# Patient Record
Sex: Female | Born: 1960 | Race: White | Hispanic: No | Marital: Married | Smoking: Never smoker
Health system: Southern US, Community
[De-identification: ages and names within clinical notes are randomized; demographics above are authoritative.]

## PROBLEM LIST (undated history)

## (undated) DIAGNOSIS — T7840XA Allergy, unspecified, initial encounter: Secondary | ICD-10-CM

## (undated) DIAGNOSIS — I773 Arterial fibromuscular dysplasia: Secondary | ICD-10-CM

## (undated) DIAGNOSIS — N3000 Acute cystitis without hematuria: Secondary | ICD-10-CM

## (undated) DIAGNOSIS — F419 Anxiety disorder, unspecified: Secondary | ICD-10-CM

## (undated) HISTORY — DX: Anxiety disorder, unspecified: F41.9

## (undated) HISTORY — DX: Allergy, unspecified, initial encounter: T78.40XA

## (undated) HISTORY — PX: NASAL SEPTUM SURGERY: SHX37

## (undated) HISTORY — DX: Arterial fibromuscular dysplasia: I77.3

## (undated) HISTORY — DX: Acute cystitis without hematuria: N30.00

## (undated) HISTORY — PX: KNEE SURGERY: SHX244

---

## 1999-03-03 ENCOUNTER — Other Ambulatory Visit: Admission: RE | Admit: 1999-03-03 | Discharge: 1999-03-03 | Payer: Self-pay | Admitting: *Deleted

## 1999-09-18 ENCOUNTER — Inpatient Hospital Stay (HOSPITAL_COMMUNITY): Admission: AD | Admit: 1999-09-18 | Discharge: 1999-09-20 | Payer: Self-pay | Admitting: *Deleted

## 2001-11-11 ENCOUNTER — Other Ambulatory Visit: Admission: RE | Admit: 2001-11-11 | Discharge: 2001-11-11 | Payer: Self-pay | Admitting: Obstetrics & Gynecology

## 2002-11-09 ENCOUNTER — Other Ambulatory Visit: Admission: RE | Admit: 2002-11-09 | Discharge: 2002-11-09 | Payer: Self-pay | Admitting: Obstetrics & Gynecology

## 2006-09-25 ENCOUNTER — Emergency Department (HOSPITAL_COMMUNITY): Admission: EM | Admit: 2006-09-25 | Discharge: 2006-09-25 | Payer: Self-pay | Admitting: Emergency Medicine

## 2008-10-25 ENCOUNTER — Emergency Department (HOSPITAL_COMMUNITY): Admission: EM | Admit: 2008-10-25 | Discharge: 2008-10-25 | Payer: Self-pay | Admitting: Emergency Medicine

## 2008-10-30 ENCOUNTER — Emergency Department (HOSPITAL_COMMUNITY): Admission: EM | Admit: 2008-10-30 | Discharge: 2008-10-30 | Payer: Self-pay | Admitting: Emergency Medicine
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
## 2010-12-14 ENCOUNTER — Inpatient Hospital Stay (INDEPENDENT_AMBULATORY_CARE_PROVIDER_SITE_OTHER)
Admission: RE | Admit: 2010-12-14 | Discharge: 2010-12-14 | Disposition: A | Discharge status: Home / Self Care | Payer: BC Managed Care – PPO | Source: Ambulatory Visit | Attending: Family Medicine | Admitting: Family Medicine

## 2010-12-14 DIAGNOSIS — L255 Unspecified contact dermatitis due to plants, except food: Secondary | ICD-10-CM

## 2011-11-30 ENCOUNTER — Ambulatory Visit: Payer: BC Managed Care – PPO | Admitting: Family Medicine

## 2012-02-11 ENCOUNTER — Ambulatory Visit: Payer: BC Managed Care – PPO | Admitting: Family Medicine

## 2013-12-13 ENCOUNTER — Encounter: Payer: Self-pay | Admitting: Podiatrist

## 2013-12-13 ENCOUNTER — Ambulatory Visit (INDEPENDENT_AMBULATORY_CARE_PROVIDER_SITE_OTHER): Payer: 59 | Admitting: Podiatrist

## 2013-12-13 VITALS — BP 144/82 | HR 88 | Resp 16 | Ht 65.0 in | Wt 198.0 lb

## 2013-12-13 DIAGNOSIS — L600 Ingrowing nail: Secondary | ICD-10-CM

## 2013-12-13 NOTE — Progress Notes (Signed)
   Subjective:    Patient ID: Virginia Garner, female    DOB: 07/17/1960, 53 y.o.   MRN: [id number]  HPI Comments: "I have some ingrowns"  Patient c/o tender 1st toe right, lateral border and 2nd toe left, lateral border for few months. The toenails are thick and discolored. The areas are red, swollen. Been using neosporin and trimming them.  Also, the 2nd toenail right foot is thick and discolored.  Toe Pain       Review of Systems  All other systems reviewed and are negative.      Objective:   Physical Exam  Patient is awake, alert, and oriented x 3.  In no acute distress.  Vascular status is intact with palpable pedal pulses at 2/4 DP and PT bilateral and capillary refill time within normal limits. Neurological sensation is also intact bilaterally via Semmes Weinstein monofilament at 5/5 sites. Light touch, vibratory sensation, Achilles tendon reflex is intact. Dermatological exam reveals skin color, turger and texture as normal. No open lesions present.  Musculature intact with dorsiflexion, plantarflexion, inversion, eversion.  Right hallux, 2nd toe bilateral are incurvated and ingrown.  The remainder of the nails 3-5 are normal on both feet.  I previously fixed her left hallux nail and she has had no problems since.  Toenails 1,2 bilateral are also mycotic and thick. There is no sign of infection present in any of the digital nails    Assessment & Plan:  Ingrown toenails right first both borders and 2nd toenails bilateral both borders.  Plan: Treatment options and alternatives discussed.  Discussed a permanent phenol matrixectomy and patient wishes to have this performed on the right first and second toenail as well as the second toenail left foot.  The digits were prepped with alcohol and a 1 to 1 mix of 0.5% marcaine plain and 2% lidocaine plain was administered in a digital block fashion.  The toes were then prepped with betadine solution and exsanguinated.  The offending nail  borders of all nails were then excised and matrix tissue exposed.  Phenol was then applied to the matrix tissue followed by an alcohol wash.  Antibiotic ointment and a dry sterile dressing was applied.  The patient was dispensed instructions for aftercare.

## 2013-12-13 NOTE — Patient Instructions (Signed)
ANTIBACTERIAL SOAP INSTRUCTIONS  THE DAY AFTER PROCEDURE  Please follow the instructions your doctor has marked.   Shower as usual. Before getting out, place a drop of antibacterial liquid soap (Dial) on a wet, clean washcloth.  Gently wipe washcloth over affected area.  Afterward, rinse the area with warm water.  Blot the area dry with a soft cloth and cover with antibiotic ointment (neosporin, polysporin, bacitracin) and band aid or gauze and tape  OR   Place 3-4 drops of antibacterial liquid soap in a quart of warm tap water.  Submerge foot into water for 20 minutes.  If bandage was applied after your procedure, leave on to allow for easy lift off, then remove and continue with soak for the remaining time.  Next, blot area dry with a soft cloth and cover with a bandage.  Apply other medications as directed by your doctor, such as cortisporin otic solution (eardrops) or neosporin antibiotic ointment   Long Term Care Instructions-Post Nail Surgery  You have had your ingrown toenail and root treated with a chemical.  This chemical causes a burn that will drain and ooze like a blister.  This can drain for 6-8 weeks or longer.  It is important to keep this area clean, covered, and follow the soaking instructions dispensed at the time of your surgery.  This area will eventually dry and form a scab.  Once the scab forms you no longer need to soak or apply a dressing.  If at any time you experience an increase in pain, redness, swelling, or drainage, you should contact the office as soon as possible.   Onychomycosis/Fungal Toenails  WHAT IS IT? An infection that lies within the keratin of your nail plate that is caused by a fungus.  WHY ME? Fungal infections affect all ages, sexes, races, and creeds.  There may be many factors that predispose you to a fungal infection such as age, coexisting medical conditions such as diabetes, or an autoimmune disease; stress, medications, fatigue, genetics, etc.   Bottom line: fungus thrives in a warm, moist environment and your shoes offer such a location.  IS IT CONTAGIOUS? Theoretically, yes.  You do not want to share shoes, nail clippers or files with someone who has fungal toenails.  Walking around barefoot in the same room or sleeping in the same bed is unlikely to transfer the organism.  It is important to realize, however, that fungus can spread easily from one nail to the next on the same foot.  HOW DO WE TREAT THIS?  There are several ways to treat this condition.  Treatment may depend on many factors such as age, medications, pregnancy, liver and kidney conditions, etc.  It is best to ask your doctor which options are available to you.  1. No treatment.   Unlike many other medical concerns, you can live with this condition.  However for many people this can be a painful condition and may lead to ingrown toenails or a bacterial infection.  It is recommended that you keep the nails cut short to help reduce the amount of fungal nail. 2. Topical treatment.  These range from herbal remedies to prescription strength nail lacquers.  About 40-50% effective, topicals require twice daily application for approximately 9 to 12 months or until an entirely new nail has grown out.  The most effective topicals are medical grade medications available through physicians offices. 3. Oral antifungal medications.  With an 80-90% cure rate, the most common oral medication requires 3  to 4 months of therapy and stays in your system for a year as the new nail grows out.  Oral antifungal medications do require blood work to make sure it is a safe drug for you.  A liver function panel will be performed prior to starting the medication and after the first month of treatment.  It is important to have the blood work performed to avoid any harmful side effects.  In general, this medication safe but blood work is required. 4. Laser Therapy.  This treatment is performed by applying a  specialized laser to the affected nail plate.  This therapy is noninvasive, fast, and non-painful.  It is not covered by insurance and is therefore, out of pocket.  The results have been very good with a 80-95% cure rate.  The [facility] is the only practice in the area to offer this therapy. 5. Permanent Nail Avulsion.  Removing the entire nail so that a new nail will not grow back.

## 2014-07-12 ENCOUNTER — Encounter (HOSPITAL_COMMUNITY): Payer: Self-pay | Admitting: Emergency Medicine

## 2014-07-12 ENCOUNTER — Emergency Department (HOSPITAL_COMMUNITY)
Admission: EM | Admit: 2014-07-12 | Discharge: 2014-07-12 | Disposition: A | Discharge status: Home / Self Care | Payer: 59 | Source: Home / Self Care | Attending: Family Medicine | Admitting: Family Medicine

## 2014-07-12 ENCOUNTER — Emergency Department (HOSPITAL_COMMUNITY): Payer: 59

## 2014-07-12 DIAGNOSIS — R509 Fever, unspecified: Secondary | ICD-10-CM

## 2014-07-12 LAB — CBC WITH DIFFERENTIAL/PLATELET
BASOS ABS: 0.1 10*3/uL (ref 0.0–0.1)
BASOS PCT: 1 % (ref 0–1)
EOS PCT: 1 % (ref 0–5)
Eosinophils Absolute: 0.1 10*3/uL (ref 0.0–0.7)
HEMATOCRIT: 40.3 % (ref 36.0–46.0)
HEMOGLOBIN: 13.2 g/dL (ref 12.0–15.0)
LYMPHS PCT: 57 % — AB (ref 12–46)
Lymphs Abs: 5.3 10*3/uL — ABNORMAL HIGH (ref 0.7–4.0)
MCH: 28.9 pg (ref 26.0–34.0)
MCHC: 32.8 g/dL (ref 30.0–36.0)
MCV: 88.4 fL (ref 78.0–100.0)
MONO ABS: 0.6 10*3/uL (ref 0.1–1.0)
MONOS PCT: 6 % (ref 3–12)
NEUTROS ABS: 3.2 10*3/uL (ref 1.7–7.7)
Neutrophils Relative %: 35 % — ABNORMAL LOW (ref 43–77)
Platelets: 196 10*3/uL (ref 150–400)
RBC: 4.56 MIL/uL (ref 3.87–5.11)
RDW: 13.9 % (ref 11.5–15.5)
WBC: 9.1 10*3/uL (ref 4.0–10.5)

## 2014-07-12 LAB — POCT URINALYSIS DIP (DEVICE)
Bilirubin Urine: NEGATIVE
GLUCOSE, UA: NEGATIVE mg/dL
HGB URINE DIPSTICK: NEGATIVE
KETONES UR: NEGATIVE mg/dL
LEUKOCYTES UA: NEGATIVE
Nitrite: NEGATIVE
Protein, ur: NEGATIVE mg/dL
SPECIFIC GRAVITY, URINE: 1.01 (ref 1.005–1.030)
UROBILINOGEN UA: 0.2 mg/dL (ref 0.0–1.0)
pH: 7 (ref 5.0–8.0)

## 2014-07-12 LAB — POCT INFECTIOUS MONO SCREEN: Mono Screen: NEGATIVE

## 2014-07-12 LAB — POCT I-STAT, CHEM 8
BUN: 4 mg/dL — ABNORMAL LOW (ref 6–23)
CHLORIDE: 99 mmol/L (ref 96–112)
CREATININE: 0.6 mg/dL (ref 0.50–1.10)
Calcium, Ion: 1.09 mmol/L — ABNORMAL LOW (ref 1.12–1.23)
GLUCOSE: 105 mg/dL — AB (ref 70–99)
HCT: 43 % (ref 36.0–46.0)
Hemoglobin: 14.6 g/dL (ref 12.0–15.0)
Potassium: 4.1 mmol/L (ref 3.5–5.1)
SODIUM: 137 mmol/L (ref 135–145)
TCO2: 26 mmol/L (ref 0–100)

## 2014-07-12 LAB — POCT RAPID STREP A: Streptococcus, Group A Screen (Direct): NEGATIVE

## 2014-07-12 NOTE — ED Provider Notes (Signed)
Virginia Garner is a 54 y.o. female who presents to Urgent Care today for Fever and fatigue present off and on for 2 weeks. Patient has had a few episodes of night sweats as well. Her temperature at home yesterday was 101F. She notes some mild nasal congestion but denies any cough vomiting diarrhea abdominal pain dysuria or vaginal discharge. Her fever is easily controlled with over-the-counter Tylenol or NSAIDs but notes it somewhat persistent now and she is concerned.   History reviewed. No pertinent past medical history. History reviewed. No pertinent past surgical history. History  Substance Use Topics  . Smoking status: Never Smoker   . Smokeless tobacco: Not on file  . Alcohol Use: Yes   ROS as above Medications: No current facility-administered medications for this encounter.   No current outpatient prescriptions on file.   No Known Allergies   Exam:  BP 151/78 mmHg  Pulse 90  Temp(Src) 100.3 F (37.9 C) (Oral)  Resp 18  SpO2 96% Gen: Well NAD nontoxic appearing HEENT: EOMI,  MMM posterior pharynx is relatively normal-appearing no significant erythema or exudate. Normal tympanic membranes bilaterally. Sinuses are nontender. Cervical lymphadenopathy present bilaterally. Lungs: Normal work of breathing. CTABL Heart: RRR no MRG Abd: NABS, Soft. Nondistended, Nontender Exts: Brisk capillary refill, warm and well perfused.   Results for orders placed or performed during the hospital encounter of 07/12/14 (from the past 24 hour(s))  CBC with Differential     Status: Abnormal   Collection Time: 07/12/14 12:16 PM  Result Value Ref Range   WBC 9.1 4.0 - 10.5 K/uL   RBC 4.56 3.87 - 5.11 MIL/uL   Hemoglobin 13.2 12.0 - 15.0 g/dL   HCT 11.940.3 14.736.0 - 82.946.0 %   MCV 88.4 78.0 - 100.0 fL   MCH 28.9 26.0 - 34.0 pg   MCHC 32.8 30.0 - 36.0 g/dL   RDW 56.213.9 13.011.5 - 86.515.5 %   Platelets 196 150 - 400 K/uL   Neutrophils Relative % 35 (L) 43 - 77 %   Neutro Abs 3.2 1.7 - 7.7 K/uL   Lymphocytes Relative 57 (H) 12 - 46 %   Lymphs Abs 5.3 (H) 0.7 - 4.0 K/uL   Monocytes Relative 6 3 - 12 %   Monocytes Absolute 0.6 0.1 - 1.0 K/uL   Eosinophils Relative 1 0 - 5 %   Eosinophils Absolute 0.1 0.0 - 0.7 K/uL   Basophils Relative 1 0 - 1 %   Basophils Absolute 0.1 0.0 - 0.1 K/uL  POCT urinalysis dip (device)     Status: None   Collection Time: 07/12/14 12:24 PM  Result Value Ref Range   Glucose, UA NEGATIVE NEGATIVE mg/dL   Bilirubin Urine NEGATIVE NEGATIVE   Ketones, ur NEGATIVE NEGATIVE mg/dL   Specific Gravity, Urine 1.010 1.005 - 1.030   Hgb urine dipstick NEGATIVE NEGATIVE   pH 7.0 5.0 - 8.0   Protein, ur NEGATIVE NEGATIVE mg/dL   Urobilinogen, UA 0.2 0.0 - 1.0 mg/dL   Nitrite NEGATIVE NEGATIVE   Leukocytes, UA NEGATIVE NEGATIVE  I-STAT, chem 8     Status: Abnormal   Collection Time: 07/12/14 12:28 PM  Result Value Ref Range   Sodium 137 135 - 145 mmol/L   Potassium 4.1 3.5 - 5.1 mmol/L   Chloride 99 96 - 112 mmol/L   BUN 4 (L) 6 - 23 mg/dL   Creatinine, Ser 7.840.60 0.50 - 1.10 mg/dL   Glucose, Bld 696105 (H) 70 - 99 mg/dL   Calcium, Ion  1.09 (L) 1.12 - 1.23 mmol/L   TCO2 26 0 - 100 mmol/L   Hemoglobin 14.6 12.0 - 15.0 g/dL   HCT 25.3 66.4 - 40.3 %  POCT rapid strep A [facility])     Status: None   Collection Time: 07/12/14 12:31 PM  Result Value Ref Range   Streptococcus, Group A Screen (Direct) NEGATIVE NEGATIVE  Infectious mono screen, POC     Status: None   Collection Time: 07/12/14 12:31 PM  Result Value Ref Range   Mono Screen NEGATIVE NEGATIVE   No results found.  Assessment and Plan: 54 y.o. female with fever. Unclear etiology at this time. Labs are not significantly abnormal.  We are unable to perform a chest x-ray today due to but adequate been malfunction. I have ordered an outpatient checks x-rays of the patient will go get tomorrow. Plan to treat with NSAIDs or Tylenol. In 2 weeks if patient is still symptomatic would recommend follow-up  with primary care provider for consideration of referral to infectious disease for workup of fever of unknown origin.  Discussed warning signs or symptoms. Please see discharge instructions. Patient expresses understanding.     Rodolph Bong, MD 07/12/14 [phone]

## 2014-07-12 NOTE — ED Notes (Signed)
Pt states that she has been feeling run down for a few weeks.  She states she has been getting a low grade fever off and on the entire time.  Yesterday the fever was 101.0.  She states she has had some sinus/head congestion and some neck pain at the base of her head.

## 2014-07-12 NOTE — Discharge Instructions (Signed)
Thank you for coming in today. Go to the [facility] ([address]) sometime in the next few days and [facility]. If not improved follow-up with primary care provider and asked for referral to infectious disease Return as needed Continue ibuprofen or Tylenol as needed   Fever, Adult A fever is a higher than normal body temperature. In an adult, an oral temperature around 98.6 F (37 C) is considered normal. A temperature of 100.4 F (38 C) or higher is generally considered a fever. Mild or moderate fevers generally have no long-term effects and often do not require treatment. Extreme fever (greater than or equal to 106 F or 41.1 C) can cause seizures. The sweating that may occur with repeated or prolonged fever may cause dehydration. Elderly people can develop confusion during a fever. A measured temperature can vary with:  Age.  Time of day.  Method of measurement (mouth, underarm, rectal, or ear). The fever is confirmed by taking a temperature with a thermometer. Temperatures can be taken different ways. Some methods are accurate and some are not.  An oral temperature is used most commonly. Electronic thermometers are fast and accurate.  An ear temperature will only be accurate if the thermometer is positioned as recommended by the manufacturer.  A rectal temperature is accurate and done for those adults who have a condition where an oral temperature cannot be taken.  An underarm (axillary) temperature is not accurate and not recommended. Fever is a symptom, not a disease.  CAUSES   Infections commonly cause fever.  Some noninfectious causes for fever include:  Some arthritis conditions.  Some thyroid or adrenal gland conditions.  Some immune system conditions.  Some types of cancer.  A medicine reaction.  High doses of certain street drugs such as methamphetamine.  Dehydration.  Exposure to high outside or room  temperatures.  Occasionally, the source of a fever cannot be determined. This is sometimes called a "fever of unknown origin" (FUO).  Some situations may lead to a temporary rise in body temperature that may go away on its own. Examples are:  Childbirth.  Surgery.  Intense exercise. HOME CARE INSTRUCTIONS   Take appropriate medicines for fever. Follow dosing instructions carefully. If you use acetaminophen to reduce the fever, be careful to avoid taking other medicines that also contain acetaminophen. Do not take aspirin for a fever if you are younger than age 53. There is an association with Reye's syndrome. Reye's syndrome is a rare but potentially deadly disease.  If an infection is present and antibiotics have been prescribed, take them as directed. Finish them even if you start to feel better.  Rest as needed.  Maintain an adequate fluid intake. To prevent dehydration during an illness with prolonged or recurrent fever, you may need to drink extra fluid.Drink enough fluids to keep your urine clear or pale yellow.  Sponging or bathing with room temperature water may help reduce body temperature. Do not use ice water or alcohol sponge baths.  Dress comfortably, but do not over-bundle. SEEK MEDICAL CARE IF:   You are unable to keep fluids down.  You develop vomiting or diarrhea.  You are not feeling at least partly better after 3 days.  You develop new symptoms or problems. SEEK IMMEDIATE MEDICAL CARE IF:   You have shortness of breath or trouble breathing.  You develop excessive weakness.  You are dizzy or you faint.  You are extremely thirsty or you are  making little or no urine.  You develop new pain that was not there before (such as in the head, neck, chest, back, or abdomen).  You have persistent vomiting and diarrhea for more than 1 to 2 days.  You develop a stiff neck or your eyes become sensitive to light.  You develop a skin rash.  You have a fever or  persistent symptoms for more than 2 to 3 days.  You have a fever and your symptoms suddenly get worse. MAKE SURE YOU:   Understand these instructions.  Will watch your condition.  Will get help right away if you are not doing well or get worse. Document Released: 11/04/2000 Document Revised: 09/25/2013 Document Reviewed: 03/12/2011 [facility] Patient Information 2015 [place], [place]. This information is not intended to replace advice given to you by your health care provider. Make sure you discuss any questions you have with your health care provider.

## 2014-07-14 LAB — CULTURE, GROUP A STREP: STREP A CULTURE: NEGATIVE

## 2016-08-05 DIAGNOSIS — R509 Fever, unspecified: Secondary | ICD-10-CM | POA: Diagnosis not present

## 2016-08-05 DIAGNOSIS — R6889 Other general symptoms and signs: Secondary | ICD-10-CM | POA: Diagnosis not present

## 2017-10-10 DIAGNOSIS — N3000 Acute cystitis without hematuria: Secondary | ICD-10-CM | POA: Diagnosis not present

## 2018-05-23 DIAGNOSIS — Z23 Encounter for immunization: Secondary | ICD-10-CM | POA: Diagnosis not present

## 2018-12-13 ENCOUNTER — Encounter: Payer: Self-pay | Admitting: Sports Medicine

## 2018-12-13 ENCOUNTER — Ambulatory Visit: Payer: 59 | Admitting: Sports Medicine

## 2018-12-13 ENCOUNTER — Other Ambulatory Visit: Payer: Self-pay

## 2018-12-13 VITALS — Temp 98.1°F

## 2018-12-13 DIAGNOSIS — L600 Ingrowing nail: Secondary | ICD-10-CM | POA: Diagnosis not present

## 2018-12-13 DIAGNOSIS — M79674 Pain in right toe(s): Secondary | ICD-10-CM | POA: Diagnosis not present

## 2018-12-13 DIAGNOSIS — L608 Other nail disorders: Secondary | ICD-10-CM

## 2018-12-13 MED ORDER — NEOMYCIN-POLYMYXIN-HC 3.5-10000-1 OT SOLN: OTIC | 0 refills | Status: DC

## 2018-12-13 NOTE — Patient Instructions (Signed)

## 2018-12-13 NOTE — Progress Notes (Signed)
Subjective: Virginia Garner is a 58 y.o. female patient presents to office today complaining of a moderately painful incurvated, red, hot, swollen right hallux lateral greater than medial nail margin. This has been present for off and on for several months with the most pain over the last 2 months.  Patient has treated this by self trimming and admits that many years ago had the same nail treated with a procedure in office and it did well initially but over the last few months off and on nail has continued to grow out into the skin and has been very painful at the corners. Patient denies fever/chills/nausea/vomitting/any other related constitutional symptoms at this time.  Review of Systems  All other systems reviewed and are negative.   There are no active problems to display for this patient.   No current outpatient medications on file prior to visit.   No current facility-administered medications on file prior to visit.     No Known Allergies  Objective:  There were no vitals filed for this visit.  General: Well developed, nourished, in no acute distress, alert and oriented x3   Dermatology: Skin is warm, dry and supple bilateral.  Right hallux nail appears to be  severely incurvated with hyperkeratosis formation at the distal aspects of  the medial and lateral nail border. (+) Erythema. (+) Edema. (-) serosanguous  drainage present. The remaining nails appear unremarkable at this time. There are no open sores, lesions or other signs of infection  present.  Vascular: Dorsalis Pedis artery and Posterior Tibial artery pedal pulses are 2/4 bilateral with immedate capillary fill time. Pedal hair growth present. No lower extremity edema.   Neruologic: Grossly intact via light touch bilateral.  Musculoskeletal: Tenderness to palpation of the right hallux bilateral nail folds. Muscular strength within normal limits in all groups bilateral.   Assesement and Plan: Problem List Items  Addressed This Visit    None    Visit Diagnoses    Ingrown toenail    -  Primary   Toe pain, right       Nail deformity          -Discussed treatment alternatives and plan of care; Explained permanent/temporary nail avulsion and post procedure course to patient. Patient elects for PNA right hallux medial and lateral nail borders - After a verbal and written consent, injected 3 ml of a 50:50 mixture of 2% plain  lidocaine and 0.5% plain marcaine in a normal hallux block fashion. Next, a  betadine prep was performed. Anesthesia was tested and found to be appropriate.  The offending right hallux medial and lateral nail borders were then incised from the hyponychium to the epinychium. The offending nail border was removed and cleared from the field. The areas were curretted for any remaining nail or spicules. Phenol application performed and the area was then flushed with alcohol and dressed with antibiotic cream and a dry sterile dressing. -Patient was instructed to leave the dressing intact for today and begin soaking  in a weak solution of betadine or Epsom salt and water tomorrow. Patient was instructed to  soak for 15-20 minutes each day and apply neosporin/corticosporin and a gauze or bandaid dressing each day. -Patient was instructed to monitor the toe for signs of infection and return to office if toe becomes red, hot or swollen. -Advised ice, elevation, and tylenol or motrin if needed for pain.  -Patient is to return in 2 weeks for follow up care/nail check or sooner if problems arise.  Landis Martins, DPM

## 2018-12-27 ENCOUNTER — Encounter: Payer: Self-pay | Admitting: Sports Medicine

## 2018-12-27 ENCOUNTER — Ambulatory Visit: Payer: 59 | Admitting: Sports Medicine

## 2018-12-27 ENCOUNTER — Other Ambulatory Visit: Payer: Self-pay

## 2018-12-27 DIAGNOSIS — Z9889 Other specified postprocedural states: Secondary | ICD-10-CM

## 2018-12-27 DIAGNOSIS — M79674 Pain in right toe(s): Secondary | ICD-10-CM

## 2018-12-27 NOTE — Progress Notes (Signed)
Subjective: Virginia Garner is a 58 y.o. female patient returns to office today for follow up evaluation after having Right Hallux medial/lateral permanent nail avulsion performed on (12-13-18). Patient has been soaking using epsom salt and applying topical antibiotic drops covered with bandaid daily. Patient deniesfever/chills/nausea/vomitting/any other related constitutional symptoms at this time.  There are no active problems to display for this patient.   Current Outpatient Medications on File Prior to Visit  Medication Sig Dispense Refill  . neomycin-polymyxin-hydrocortisone (CORTISPORIN) OTIC solution Apply 1-2 drops to toe after soaking twice a day 10 mL 0   No current facility-administered medications on file prior to visit.     No Known Allergies  Objective:  General: Well developed, nourished, in no acute distress, alert and oriented x3   Dermatology: Skin is warm, dry and supple bilateral. Right hallux medial/lateral nail bed appears to be clean, dry, with mild granular tissue and surrounding eschar/scab. (-) Erythema. (-) Edema. (-) serosanguous drainage present. The remaining nails appear unremarkable at this time. There are no other lesions or other signs of infection present.  Neurovascular status: Intact. No lower extremity swelling; No pain with calf compression bilateral.  Musculoskeletal: Decreased tenderness to palpation of the right hallux nail fold(s). Muscular strength within normal limits bilateral.   Assesement and Plan: Problem List Items Addressed This Visit    None    Visit Diagnoses    S/P nail surgery    -  Primary   Toe pain, right          -Examined patient  -Cleansed right hallux medial and lateral nail folds and gently scrubbed with peroxide and q-tip/curetted away eschar at site and applied antibiotic cream covered with bandaid.  -Discussed plan of care with patient. -Patient to now begin soaking in a weak solution of Epsom salt and warm water.  Patient was instructed to soak for 15-20 minutes each day until the toe appears normal and there is no drainage, redness, tenderness, or swelling at the procedure site, and apply corticosporin and a gauze or bandaid dressing each day as needed for atleast 1 more week. May leave open to air at night. -Educated patient on long term care after nail surgery. -Patient was instructed to monitor the toe for reoccurrence and signs of infection; Patient advised to return to office or go to ER if toe becomes red, hot or swollen. -Patient is to return as needed or sooner if problems arise.  Landis Martins, DPM

## 2021-08-14 ENCOUNTER — Encounter: Payer: Self-pay | Admitting: Family Medicine

## 2021-08-14 ENCOUNTER — Ambulatory Visit (INDEPENDENT_AMBULATORY_CARE_PROVIDER_SITE_OTHER): Payer: BC Managed Care – PPO | Admitting: Family Medicine

## 2021-08-14 VITALS — BP 126/84 | HR 91 | Temp 98.7°F | Ht 65.0 in | Wt 197.4 lb

## 2021-08-14 DIAGNOSIS — R1010 Upper abdominal pain, unspecified: Secondary | ICD-10-CM | POA: Diagnosis not present

## 2021-08-14 LAB — COMPREHENSIVE METABOLIC PANEL
ALT: 19 U/L (ref 0–35)
AST: 16 U/L (ref 0–37)
Albumin: 4.5 g/dL (ref 3.5–5.2)
Alkaline Phosphatase: 84 U/L (ref 39–117)
BUN: 8 mg/dL (ref 6–23)
CO2: 28 mEq/L (ref 19–32)
Calcium: 9.3 mg/dL (ref 8.4–10.5)
Chloride: 105 mEq/L (ref 96–112)
Creatinine, Ser: 0.6 mg/dL (ref 0.40–1.20)
GFR: 97.88 mL/min (ref >60.00)
Glucose, Bld: 94 mg/dL (ref 70–99)
Potassium: 4 mEq/L (ref 3.5–5.1)
Sodium: 141 mEq/L (ref 135–145)
Total Bilirubin: 0.5 mg/dL (ref 0.2–1.2)
Total Protein: 7.4 g/dL (ref 6.0–8.3)

## 2021-08-14 LAB — LIPASE: Lipase: 35 U/L (ref 11.0–59.0)

## 2021-08-14 LAB — CBC WITH DIFFERENTIAL/PLATELET
Basophils Absolute: 0 10*3/uL (ref 0.0–0.1)
Basophils Relative: 0.8 % (ref 0.0–3.0)
Eosinophils Absolute: 0.1 10*3/uL (ref 0.0–0.7)
Eosinophils Relative: 1.9 % (ref 0.0–5.0)
HCT: 38.6 % (ref 36.0–46.0)
Hemoglobin: 13 g/dL (ref 12.0–15.0)
Lymphocytes Relative: 29.7 % (ref 12.0–46.0)
Lymphs Abs: 1.7 10*3/uL (ref 0.7–4.0)
MCHC: 33.6 g/dL (ref 30.0–36.0)
MCV: 89.5 fl (ref 78.0–100.0)
Monocytes Absolute: 0.3 10*3/uL (ref 0.1–1.0)
Monocytes Relative: 5.6 % (ref 3.0–12.0)
Neutro Abs: 3.5 10*3/uL (ref 1.4–7.7)
Neutrophils Relative %: 62 % (ref 43.0–77.0)
Platelets: 295 10*3/uL (ref 150.0–400.0)
RBC: 4.31 Mil/uL (ref 3.87–5.11)
RDW: 13.3 % (ref 11.5–15.5)
WBC: 5.6 10*3/uL (ref 4.0–10.5)

## 2021-08-14 LAB — AMYLASE: Amylase: 33 U/L (ref 27–131)

## 2021-08-14 MED ORDER — OMEPRAZOLE 40 MG PO CPDR: 40.0000 mg | DELAYED_RELEASE_CAPSULE | Freq: Every day | ORAL | 1 refills | Status: DC

## 2021-08-14 NOTE — Progress Notes (Signed)
? ?New Patient Office Visit ? ?Subjective:  ?Patient ID: Virginia Garner, female    DOB: 05-Jun-1960  Age: 61 y.o. MRN: [id number] ? ?CC:  ?Chief Complaint  ?Patient presents with  ? Establish Care  ?  Had coffee and cream, no food ?  ? ? ?HPI ?Virginia Garner presents for new pt ? Since late December-intermitt tightness upper abd and feels like something pushing up on area.  Occ burning upper abd. No wt changes. No pain to palp.  Pretty constant but not worse.  Feels like has to lean back at times. Last pm-jalapenos-more burning.  No n/v/d/c.  Never colonoscopy. No dark stools.  Mgpa-dec 55 "stomach ca".   No dysphagia ?Last pap-Dr. LeVoir-2019 or so. Never mamm.  No spotting.  Menopause about 51.  No pain w/intercourse ?Varicose veins for long time.  More edema L ankle.  ? ?Past Medical History:  ?Diagnosis Date  ? Allergy   ? Anxiety   ? ? ?Past Surgical History:  ?Procedure Laterality Date  ? KNEE SURGERY Right   ? age 19  ? NASAL SEPTUM SURGERY    ? age 13  ? ? ?Family History  ?Problem Relation Age of Onset  ? Hearing loss Mother   ? Depression Mother   ? Arthritis Mother   ? Cancer Father   ? Arthritis Father   ? Prostate cancer Father   ? Cancer Maternal Grandfather   ? Stomach cancer Maternal Grandfather   ? Heart disease Paternal Grandmother   ? Heart disease Paternal Grandfather   ? ? ?Social History  ? ?Socioeconomic History  ? Marital status: Married  ?  Spouse name: Not on file  ? Number of children: 3  ? Years of education: Not on file  ? Highest education level: Not on file  ?Occupational History  ? Not on file  ?Tobacco Use  ? Smoking status: Never  ? Smokeless tobacco: Never  ?Vaping Use  ? Vaping Use: Never used  ?Substance and Sexual Activity  ? Alcohol use: Yes  ?  Comment: sociallly  ? Drug use: Never  ? Sexual activity: Yes  ?Other Topics Concern  ? Not on file  ?Social History Narrative  ? Not on file  ? ?Social Determinants of Health  ? ?Financial Resource Strain: Not on file  ?Food Insecurity: Not  on file  ?Transportation Needs: Not on file  ?Physical Activity: Not on file  ?Stress: Not on file  ?Social Connections: Not on file  ?Intimate Partner Violence: Not on file  ? ? ?ROS  ?ROS: ?Gen: no fever, chills  ?Skin: no rash, itching ?ENT: no ear pain, ear drainage, nasal congestion, rhinorrhea, sinus pressure, sore throat ?Eyes: no blurry vision, double vision ?Resp: no cough, wheeze,SOB ?CV: no CP, palpitations, LE edema,  ?GI: HPI ?GU: no dysuria, urgency, frequency, hematuria ?MSK: occ knee pain L ?Neuro: no dizziness, headache, weakness, vertigo ?Psych: no depression, anxiety, insomnia, SI  ? ?Objective:  ? ?Today's Vitals: BP 126/84   Pulse 91   Temp 98.7 ?F (37.1 ?C) (Temporal)   Ht 5\' 5"  (1.651 m)   Wt 197 lb 6 oz (89.5 kg)   SpO2 97%   BMI 32.84 kg/m?  ? ?Physical Exam  ?Gen: WDWN NAD [facility] ?HEENT: NCAT, conjunctiva not injected, sclera nonicteric ?TM WNL B, OP moist, no exudates  ?NECK:  supple, no thyromegaly, no nodes, no carotid bruits ?CARDIAC: RRR, S1S2+, no murmur. DP 2+B ?LUNGS: CTAB. No wheezes ?ABDOMEN:  BS+, soft, mildly tender  mid epi, No HSM, no masses ?EXT: +edema L>R and significant varicose veins w/old wound L inner lower leg ?MSK: no gross abnormalities.  ?NEURO: A&O x3.  CN II-XII intact.  ?PSYCH: normal mood. Good eye contact  ? ?Assessment & Plan:  ? ?Problem List Items Addressed This Visit   ?None ?Visit Diagnoses   ? ? Pain of upper abdomen    -  Primary  ? ?  ? ? Upper abd pain-?GERD, pancreas, other.  Check amylase,lipase,cbc,cmp.  Start PPI for 12 wks.  If things worse, warning signs, not improving,  consider u/s ?2.  Varicose veins-compression stockings.  Consider vasc ?Outpatient Encounter Medications as of 08/14/2021  ?Medication Sig  ? [DISCONTINUED] neomycin-polymyxin-hydrocortisone (CORTISPORIN) OTIC solution Apply 1-2 drops to toe after soaking twice a day  ? ?No facility-administered encounter medications on file as of 08/14/2021.  ? ? ?Follow-up: No follow-ups on  file.  ? ?Angelena Sole, MD ?

## 2021-08-14 NOTE — Patient Instructions (Signed)
Welcome to [facility]! It was a pleasure meeting you today. ? ?As discussed, Please schedule a 1-2 month follow up visit today. ?If not improving in few weeks, let me know.  Decrease caffeine and spicey foods ? ?PLEASE NOTE: ? ?If you had any LAB tests please let us know if you have not heard back within a few days. You may see your results on MyChart before we have a chance to review them but we will give you a call once they are reviewed by [place]. If we ordered any REFERRALS today, please let us know if you have not heard from their office within the next week.  ?Let us know through MyChart if you are needing REFILLS, or have your pharmacy send [place] the request. You can also use MyChart to communicate with me or any office staff. ? ?Please try these tips to maintain a healthy lifestyle: ? ?Eat most of your calories during the day when you are active. Eliminate processed foods including packaged sweets (pies, cakes, cookies), reduce intake of potatoes, white bread, white pasta, and white rice. Look for whole grain options, oat flour or almond flour. ? ?Each meal should contain half fruits/vegetables, one quarter protein, and one quarter carbs (no bigger than a computer mouse). ? ?Cut down on sweet beverages. This includes juice, soda, and sweet tea. Also watch fruit intake, though this is a healthier sweet option, it still contains natural sugar! Limit to 3 servings daily. ? ?Drink at least 1 glass of water with each meal and aim for at least 8 glasses per day ? ?Exercise at least 150 minutes every week.   ?

## 2021-09-10 ENCOUNTER — Encounter: Payer: Self-pay | Admitting: Family Medicine

## 2021-09-10 ENCOUNTER — Ambulatory Visit (INDEPENDENT_AMBULATORY_CARE_PROVIDER_SITE_OTHER): Payer: BC Managed Care – PPO | Admitting: Family Medicine

## 2021-09-10 ENCOUNTER — Telehealth: Payer: Self-pay | Admitting: Family Medicine

## 2021-09-10 VITALS — BP 140/82 | HR 90 | Temp 98.4°F | Ht 65.0 in | Wt 200.1 lb

## 2021-09-10 DIAGNOSIS — W57XXXA Bitten or stung by nonvenomous insect and other nonvenomous arthropods, initial encounter: Secondary | ICD-10-CM

## 2021-09-10 DIAGNOSIS — S70361A Insect bite (nonvenomous), right thigh, initial encounter: Secondary | ICD-10-CM

## 2021-09-10 MED ORDER — DOXYCYCLINE HYCLATE 100 MG PO TABS: 100.0000 mg | ORAL_TABLET | Freq: Two times a day (BID) | ORAL | 0 refills | Status: DC

## 2021-09-10 NOTE — Telephone Encounter (Signed)
Pt was already seen today by [facility]. ? ?Patient ?Name: ?Virginia MYK ?Garner ?Gender: Female ?DOB: 08-Jul-1960 ?Age: 61 Y 12 D ?Return ?Phone ?Number: ?[phone] ?(Primary), ?[id number] ?(Secondary) ?Address: ?City/ ?State/ ?Zip: ?[place] ? [address] ?[facility] ?Clie ?Health visitor at [facility] ?Contact Type Call ?Who Is Calling Patient / Member / Family / Caregiver ?Call Type Triage / Clinical ?Relationship To Patient Self ?Return Phone Number [phone] (Primary) ?Chief Complaint Tick Bite ?Reason for Call Request to Schedule Office Appointment ?Initial Comment Caller has been bitten by a tick, red circle around ?the bite, already took it out, itchy, needs to ?schedule an appt to be seen today. DR Ruthine Dose. ?Translation No ?No Triage Reason Patient declined ?Nurse Assessment ?Nurse: Suezanne Jacquet, RN, Riley Lam Date/Time (Eastern Time): 09/10/2021 9:20:16 AM ?Confirm and document reason for call. If ?symptomatic, describe symptoms. ?---Caller has been bitten by a tick, red circle around ?the bite, already took it out, itchy, needs to schedule an ?appt to be seen today. DR Ruthine Dose. ?Does the patient have any new or worsening ?symptoms? ---Yes ?Will a triage be completed? ---No ?Select reason for no triage. ---Patient declined ?Please document clinical information provided and ?list any resource used. ?---Caller states she has already secured an early ?morning appointment with her doctor and no longer ?needs our assistance Declines nurse triage at this time. ?Disp. Time (Eastern ?Time) Disposition Final User ?09/10/2021 9:21:23 AM Clinical Call Yes Suezanne Jacquet, RN, Riley Lam ?

## 2021-09-10 NOTE — Patient Instructions (Signed)
It was very nice to see you today! ? ?Cool compresses.   Stay out sun while on doxy ? ? ?PLEASE NOTE: ? ?If you had any lab tests please let us know if you have not heard back within a few days. You may see your results on MyChart before we have a chance to review them but we will give you a call once they are reviewed by [place]. If we ordered any referrals today, please let us know if you have not heard from their office within the next week.  ? ?Please try these tips to maintain a healthy lifestyle: ? ?Eat most of your calories during the day when you are active. Eliminate processed foods including packaged sweets (pies, cakes, cookies), reduce intake of potatoes, white bread, white pasta, and white rice. Look for whole grain options, oat flour or almond flour. ? ?Each meal should contain half fruits/vegetables, one quarter protein, and one quarter carbs (no bigger than a computer mouse). ? ?Cut down on sweet beverages. This includes juice, soda, and sweet tea. Also watch fruit intake, though this is a healthier sweet option, it still contains natural sugar! Limit to 3 servings daily. ? ?Drink at least 1 glass of water with each meal and aim for at least 8 glasses per day ? ?Exercise at least 150 minutes every week.   ?

## 2021-09-10 NOTE — Telephone Encounter (Signed)
Noted  

## 2021-09-10 NOTE — Progress Notes (Signed)
? ?  Subjective:  ? ? ? Patient ID: Virginia Garner, female    DOB: 1960-12-07, 61 y.o.   MRN: [id number] ? ?Chief Complaint  ?Patient presents with  ? Tick Removal  ?  Tick bite on right inner thigh that happened Monday ?Small tick, patient pulled it off, area has a bump with a red circle around it that itches  ? ? ?HPI ?Tick bite R inner thigh for 2 day.  No f/c. No myalgias.  Itchy yesterday.  Today, redness and itchy. ?Had been attached <24 yrs. Tick was not engorged. ? ?Health Maintenance Due  ?Topic Date Due  ? HIV Screening  Never done  ? Hepatitis C Screening  Never done  ? TETANUS/TDAP  Never done  ? PAP SMEAR-Modifier  Never done  ? COLONOSCOPY (Pts 45-88yrs Insurance coverage will need to be confirmed)  Never done  ? MAMMOGRAM  Never done  ? Zoster Vaccines- Shingrix (1 of 2) Never done  ? ? ?Past Medical History:  ?Diagnosis Date  ? Allergy   ? Anxiety   ? ? ?Past Surgical History:  ?Procedure Laterality Date  ? KNEE SURGERY Right   ? age 61-loose body  ? NASAL SEPTUM SURGERY    ? age 77  ? ? ?Outpatient Medications Prior to Visit  ?Medication Sig Dispense Refill  ? omeprazole (PRILOSEC) 40 MG capsule Take 1 capsule (40 mg total) by mouth daily. 90 capsule 1  ? ?No facility-administered medications prior to visit.  ? ? ?No Known Allergies ?ROS neg/noncontributory except as noted HPI/below ?Less GI discomfort.   ? ? ?   ?Objective:  ?  ? ?BP 140/82   Pulse 90   Temp 98.4 ?F (36.9 ?C) (Temporal)   Ht 5\' 5"  (1.651 m)   Wt 200 lb 2 oz (90.8 kg)   SpO2 96%   BMI 33.30 kg/m?  ?Wt Readings from Last 3 Encounters:  ?09/10/21 200 lb 2 oz (90.8 kg)  ?08/14/21 197 lb 6 oz (89.5 kg)  ?12/13/13 198 lb (89.8 kg)  ? ? ?Physical Exam  ? ?Gen: WDWN NAD ?HEENT: NCAT, conjunctiva not injected, sclera nonicteric ?EXT:  no edema ?R upper inner thigh-tick bite-no apparent leftover parts.  Approx 4cm area of erythema. ?MSK: no gross abnormalities.  ?NEURO: A&O x3.  CN II-XII intact.  ?PSYCH: normal mood. Good eye contact ? ?    ?Assessment & Plan:  ? ?Problem List Items Addressed This Visit   ?None ?Visit Diagnoses   ? ? Tick bite of thigh, right, initial encounter    -  Primary  ? ?  ? Tick bite R upper thigh-as >4cm erythema, will tx doxy.  Stay out of sun ? ?No orders of the defined types were placed in this encounter. ? ? ?12/15/13, MD ? ?

## 2021-12-20 ENCOUNTER — Telehealth: Payer: 59 | Admitting: Physician Assistant

## 2021-12-20 DIAGNOSIS — U071 COVID-19: Secondary | ICD-10-CM | POA: Diagnosis not present

## 2021-12-20 MED ORDER — NIRMATRELVIR/RITONAVIR (PAXLOVID)TABLET: 3.0000 | ORAL_TABLET | Freq: Two times a day (BID) | ORAL | 0 refills | Status: AC

## 2021-12-20 NOTE — Patient Instructions (Signed)
For current/suspected COVID symptoms: - Please watch closely for new onset shortness of breath, worsening shortness of breath, dizziness, confusion or any worsening symptoms. If any of these occur, please contact us during business hours, and if after business hours, please seek urgent care or go to the closest emergency room.  -Consider purchasing a pulse oximeter. If your levels are 94% or below persistently, please seek care at the hospital.   -If you test positive for COVID, everyone, regardless of vaccination status, should stay home for 5 days since symptom onset (or if asymptomatic, on day of positive test.) If you have no symptoms or your symptoms are resolving after 5 days, you can leave your house. Continue to wear a mask around others for 5 additional days. If you have a fever, continue to stay home until your fever resolves without use of medication.  -Please inform any contacts of your positive result so they can appropriately quarantine/test.  -Push fluids and try to eat small, frequent meals with protein to maintain your stamina.    

## 2021-12-20 NOTE — Progress Notes (Signed)
Virtual Visit Consent   Virginia Garner, you are scheduled for a virtual visit with a [facility] provider today. Just as with appointments in the office, your consent must be obtained to participate. Your consent will be active for this visit and any virtual visit you may have with one of our providers in the next 365 days. If you have a MyChart account, a copy of this consent can be sent to you electronically.  As this is a virtual visit, video technology does not allow for your provider to perform a traditional examination. This may limit your provider's ability to fully assess your condition. If your provider identifies any concerns that need to be evaluated in person or the need to arrange testing (such as labs, EKG, etc.), we will make arrangements to do so. Although advances in technology are sophisticated, we cannot ensure that it will always work on either your end or our end. If the connection with a video visit is poor, the visit may have to be switched to a telephone visit. With either a video or telephone visit, we are not always able to ensure that we have a secure connection.  By engaging in this virtual visit, you consent to the provision of healthcare and authorize for your insurance to be billed (if applicable) for the services provided during this visit. Depending on your insurance coverage, you may receive a charge related to this service.  I need to obtain your verbal consent now. Are you willing to proceed with your visit today? Virginia Garner has provided verbal consent on 12/20/2021 for a virtual visit (video or telephone). Virginia Garner, [place]  Date: 12/20/2021 10:16 AM  Virtual Visit via Video Note   I, Virginia Garner, connected with  Joyelle Siedlecki  ([id number], [birth year]) on 12/20/21 at 10:15 AM EDT by a video-enabled telemedicine application and verified that I am speaking with the correct person using two identifiers.  Location: Patient: Virtual Visit Location Patient:  Home Provider: Virtual Visit Location Provider: Home Office   I discussed the limitations of evaluation and management by telemedicine and the availability of in person appointments. The patient expressed understanding and agreed to proceed.    History of Present Illness: Virginia Garner is a 61 y.o. who identifies as a female who was assigned female at birth, and is being seen today for COVID.  Husband has COVID, he had sx one day prior to her, he is starting anti-viral. Traveling internationally for the last two weeks -- just got back from [place] last night. Had COVID last December, sx lasted two days and didn't take any rx.  Symptoms now x 3 days -- not getting worse/better. Had subjective fever yesterday. Ear pressure, scratchy throat, dry cough, fatigued. Jet lagged as well. Has not tried anything for symptoms. No hx of asthma, COPD, tobacco use.  Eating and drinking well. Reduced smell.   Has had 3 COVID vaccines.  HPI: HPI  Problems: There are no problems to display for this patient.   Allergies: No Known Allergies Medications:  Current Outpatient Medications:    doxycycline (VIBRA-TABS) 100 MG tablet, Take 1 tablet (100 mg total) by mouth 2 (two) times daily., Disp: 20 tablet, Rfl: 0   omeprazole (PRILOSEC) 40 MG capsule, Take 1 capsule (40 mg total) by mouth daily., Disp: 90 capsule, Rfl: 1  Observations/Objective: Patient is well-developed, well-nourished in no acute distress.  Resting comfortably  at home.  Head is normocephalic, atraumatic.  No labored breathing.  Speech is clear and coherent  with logical content.  Patient is alert and oriented at baseline.   Assessment and Plan: 1. COVID-19 She appears well and without distress Discussed Paxlovid -- she is interested but would also like to wait maybe 24 hours to see if symptoms improve on their own -- she is aware that this needs to be taken within first 5 days of sx onset Rest, fluids, tylenol prn Follow-up with  PCP if any further concerns; urgent care or ER if sudden worsening  Follow Up Instructions: I discussed the assessment and treatment plan with the patient. The patient was provided an opportunity to ask questions and all were answered. The patient agreed with the plan and demonstrated an understanding of the instructions.  A copy of instructions were sent to the patient via MyChart unless otherwise noted below.   The patient was advised to call back or seek an in-person evaluation if the symptoms worsen or if the condition fails to improve as anticipated.  Time:  I spent 15 minutes with the patient via telehealth technology discussing the above problems/concerns.    Virginia Garner, [place]

## 2022-02-16 ENCOUNTER — Encounter: Payer: Self-pay | Admitting: *Deleted

## 2022-05-07 ENCOUNTER — Encounter: Payer: Self-pay | Admitting: *Deleted

## 2022-10-31 ENCOUNTER — Emergency Department (HOSPITAL_COMMUNITY): Payer: 59

## 2022-10-31 ENCOUNTER — Emergency Department (HOSPITAL_COMMUNITY)
Admission: EM | Admit: 2022-10-31 | Discharge: 2022-10-31 | Disposition: A | Discharge status: Home / Self Care | Payer: 59 | Attending: Emergency Medicine | Admitting: Emergency Medicine

## 2022-10-31 ENCOUNTER — Encounter (HOSPITAL_COMMUNITY): Payer: Self-pay | Admitting: Emergency Medicine

## 2022-10-31 ENCOUNTER — Other Ambulatory Visit: Payer: Self-pay

## 2022-10-31 DIAGNOSIS — N3000 Acute cystitis without hematuria: Secondary | ICD-10-CM

## 2022-10-31 DIAGNOSIS — F10120 Alcohol abuse with intoxication, uncomplicated: Secondary | ICD-10-CM | POA: Insufficient documentation

## 2022-10-31 DIAGNOSIS — F10920 Alcohol use, unspecified with intoxication, uncomplicated: Secondary | ICD-10-CM

## 2022-10-31 DIAGNOSIS — W19XXXA Unspecified fall, initial encounter: Secondary | ICD-10-CM

## 2022-10-31 DIAGNOSIS — Y907 Blood alcohol level of 200-239 mg/100 ml: Secondary | ICD-10-CM | POA: Insufficient documentation

## 2022-10-31 DIAGNOSIS — R4781 Slurred speech: Secondary | ICD-10-CM | POA: Diagnosis not present

## 2022-10-31 DIAGNOSIS — I773 Arterial fibromuscular dysplasia: Secondary | ICD-10-CM

## 2022-10-31 DIAGNOSIS — W1839XA Other fall on same level, initial encounter: Secondary | ICD-10-CM | POA: Insufficient documentation

## 2022-10-31 LAB — URINALYSIS, ROUTINE W REFLEX MICROSCOPIC
Bilirubin Urine: NEGATIVE
Glucose, UA: NEGATIVE mg/dL
Hgb urine dipstick: NEGATIVE
Ketones, ur: 5 mg/dL — AB
Nitrite: NEGATIVE
Protein, ur: NEGATIVE mg/dL
Specific Gravity, Urine: 1.01 (ref 1.005–1.030)
pH: 5 (ref 5.0–8.0)

## 2022-10-31 LAB — CBC WITH DIFFERENTIAL/PLATELET
Abs Immature Granulocytes: 0.02 10*3/uL (ref 0.00–0.07)
Basophils Absolute: 0.1 10*3/uL (ref 0.0–0.1)
Basophils Relative: 1 %
Eosinophils Absolute: 0.1 10*3/uL (ref 0.0–0.5)
Eosinophils Relative: 1 %
HCT: 40 % (ref 36.0–46.0)
Hemoglobin: 13.3 g/dL (ref 12.0–15.0)
Immature Granulocytes: 0 %
Lymphocytes Relative: 22 %
Lymphs Abs: 1.7 10*3/uL (ref 0.7–4.0)
MCH: 30.6 pg (ref 26.0–34.0)
MCHC: 33.3 g/dL (ref 30.0–36.0)
MCV: 92 fL (ref 80.0–100.0)
Monocytes Absolute: 0.2 10*3/uL (ref 0.1–1.0)
Monocytes Relative: 3 %
Neutro Abs: 5.5 10*3/uL (ref 1.7–7.7)
Neutrophils Relative %: 73 %
Platelets: 265 10*3/uL (ref 150–400)
RBC: 4.35 MIL/uL (ref 3.87–5.11)
RDW: 13.5 % (ref 11.5–15.5)
WBC: 7.5 10*3/uL (ref 4.0–10.5)
nRBC: 0 % (ref 0.0–0.2)

## 2022-10-31 LAB — I-STAT CHEM 8, ED
BUN: 6 mg/dL — ABNORMAL LOW (ref 8–23)
Calcium, Ion: 0.98 mmol/L — ABNORMAL LOW (ref 1.15–1.40)
Chloride: 105 mmol/L (ref 98–111)
Creatinine, Ser: 0.7 mg/dL (ref 0.44–1.00)
Glucose, Bld: 115 mg/dL — ABNORMAL HIGH (ref 70–99)
HCT: 40 % (ref 36.0–46.0)
Hemoglobin: 13.6 g/dL (ref 12.0–15.0)
Potassium: 3.7 mmol/L (ref 3.5–5.1)
Sodium: 141 mmol/L (ref 135–145)
TCO2: 22 mmol/L (ref 22–32)

## 2022-10-31 LAB — CBG MONITORING, ED: Glucose-Capillary: 123 mg/dL — ABNORMAL HIGH (ref 70–99)

## 2022-10-31 LAB — ETHANOL: Alcohol, Ethyl (B): 231 mg/dL — ABNORMAL HIGH (ref <10)

## 2022-10-31 MED ORDER — CEPHALEXIN 500 MG PO CAPS: 500.0000 mg | ORAL_CAPSULE | Freq: Four times a day (QID) | ORAL | 0 refills | Status: DC

## 2022-10-31 MED ORDER — IOHEXOL 350 MG/ML SOLN
75.0000 mL | Freq: Once | INTRAVENOUS | Status: AC | PRN
  Administered 2022-10-31: 75 mL via INTRAVENOUS

## 2022-10-31 MED ORDER — SODIUM CHLORIDE 0.9 % IV SOLN
1.0000 g | Freq: Once | INTRAVENOUS | Status: AC
  Administered 2022-10-31: 1 g via INTRAVENOUS
  Filled 2022-10-31: qty 10

## 2022-10-31 NOTE — ED Provider Notes (Signed)
[place] EMERGENCY DEPARTMENT AT [facility] Provider Note   CSN: [id number] Arrival date & time: 10/31/22  6606     History  Chief Complaint  Patient presents with   Marletta Lor    Virginia Garner is a 62 y.o. female.  The history is provided by the EMS personnel. The history is limited by the condition of the patient.  Fall This is a new problem. The current episode started less than 1 hour ago. The problem occurs constantly. The problem has been resolved. Pertinent negatives include no chest pain, no abdominal pain, no headaches and no shortness of breath. Nothing aggravates the symptoms. Nothing relieves the symptoms. She has tried nothing for the symptoms. The treatment provided no relief.  Patient who reports she drinks a bottle of wine daily on the weekends presents after drinking wine and then falling.      Past Medical History:  Diagnosis Date   Allergy    Anxiety      Home Medications Prior to Admission medications   Medication Sig Start Date End Date Taking? Authorizing Provider  doxycycline (VIBRA-TABS) 100 MG tablet Take 1 tablet (100 mg total) by mouth 2 (two) times daily. 09/10/21   Jeani Sow, MD  omeprazole (PRILOSEC) 40 MG capsule Take 1 capsule (40 mg total) by mouth daily. 08/14/21   Jeani Sow, MD      Allergies    Patient has no known allergies.    Review of Systems   Review of Systems  Constitutional:  Negative for fever.  HENT:  Negative for facial swelling.   Respiratory:  Negative for shortness of breath.   Cardiovascular:  Negative for chest pain.  Gastrointestinal:  Negative for abdominal pain.  Neurological:  Negative for speech difficulty, weakness, numbness and headaches.  All other systems reviewed and are negative.   Physical Exam Updated Vital Signs BP 107/61   Pulse 90   Temp 98.1 F (36.7 C) (Oral)   Resp 14   Ht 5\' 5"  (1.651 m)   Wt 91 kg   SpO2 97%   BMI 33.38 kg/m  Physical Exam Vitals and nursing note  reviewed.  Constitutional:      General: She is not in acute distress.    Appearance: She is well-developed.  HENT:     Head: Normocephalic and atraumatic.     Nose: Nose normal.     Mouth/Throat:     Mouth: Mucous membranes are moist.     Pharynx: Oropharynx is clear.  Eyes:     Extraocular Movements: Extraocular movements intact.     Pupils: Pupils are equal, round, and reactive to light.  Cardiovascular:     Rate and Rhythm: Normal rate and regular rhythm.     Pulses: Normal pulses.     Heart sounds: Normal heart sounds.  Pulmonary:     Effort: Pulmonary effort is normal. No respiratory distress.     Breath sounds: Normal breath sounds.  Abdominal:     General: Bowel sounds are normal. There is no distension.     Palpations: Abdomen is soft.     Tenderness: There is no abdominal tenderness. There is no guarding or rebound.  Genitourinary:    Vagina: No vaginal discharge.  Musculoskeletal:        General: Normal range of motion.     Cervical back: Normal range of motion and neck supple. No tenderness.  Skin:    General: Skin is warm and dry.     Capillary  Refill: Capillary refill takes less than 2 seconds.     Findings: No erythema or rash.  Neurological:     General: No focal deficit present.     Mental Status: She is oriented to person, place, and time.     Cranial Nerves: No cranial nerve deficit.     Deep Tendon Reflexes: Reflexes normal.  Psychiatric:        Mood and Affect: Mood normal.     ED Results / Procedures / Treatments   Labs (all labs ordered are listed, but only abnormal results are displayed) Results for orders placed or performed during the hospital encounter of 10/31/22  CBC with Differential  Result Value Ref Range   WBC 7.5 4.0 - 10.5 K/uL   RBC 4.35 3.87 - 5.11 MIL/uL   Hemoglobin 13.3 12.0 - 15.0 g/dL   HCT 16.1 09.6 - 04.5 %   MCV 92.0 80.0 - 100.0 fL   MCH 30.6 26.0 - 34.0 pg   MCHC 33.3 30.0 - 36.0 g/dL   RDW 40.9 81.1 - 91.4 %    Platelets 265 150 - 400 K/uL   nRBC 0.0 0.0 - 0.2 %   Neutrophils Relative % 73 %   Neutro Abs 5.5 1.7 - 7.7 K/uL   Lymphocytes Relative 22 %   Lymphs Abs 1.7 0.7 - 4.0 K/uL   Monocytes Relative 3 %   Monocytes Absolute 0.2 0.1 - 1.0 K/uL   Eosinophils Relative 1 %   Eosinophils Absolute 0.1 0.0 - 0.5 K/uL   Basophils Relative 1 %   Basophils Absolute 0.1 0.0 - 0.1 K/uL   Immature Granulocytes 0 %   Abs Immature Granulocytes 0.02 0.00 - 0.07 K/uL  Urinalysis, Routine w reflex microscopic -Urine, Clean Catch  Result Value Ref Range   Color, Urine YELLOW YELLOW   APPearance HAZY (A) CLEAR   Specific Gravity, Urine 1.010 1.005 - 1.030   pH 5.0 5.0 - 8.0   Glucose, UA NEGATIVE NEGATIVE mg/dL   Hgb urine dipstick NEGATIVE NEGATIVE   Bilirubin Urine NEGATIVE NEGATIVE   Ketones, ur 5 (A) NEGATIVE mg/dL   Protein, ur NEGATIVE NEGATIVE mg/dL   Nitrite NEGATIVE NEGATIVE   Leukocytes,Ua MODERATE (A) NEGATIVE   RBC / HPF 0-5 0 - 5 RBC/hpf   WBC, UA 11-20 0 - 5 WBC/hpf   Bacteria, UA RARE (A) NONE SEEN   Squamous Epithelial / HPF 0-5 0 - 5 /HPF   Mucus PRESENT    Hyaline Casts, UA PRESENT   Ethanol  Result Value Ref Range   Alcohol, Ethyl (B) 231 (H) <10 mg/dL  I-stat chem 8, ED (not at [facility], [facility] or [facility])  Result Value Ref Range   Sodium 141 135 - 145 mmol/L   Potassium 3.7 3.5 - 5.1 mmol/L   Chloride 105 98 - 111 mmol/L   BUN 6 (L) 8 - 23 mg/dL   Creatinine, Ser 7.82 0.44 - 1.00 mg/dL   Glucose, Bld 956 (H) 70 - 99 mg/dL   Calcium, Ion 2.13 (L) 1.15 - 1.40 mmol/L   TCO2 22 22 - 32 mmol/L   Hemoglobin 13.6 12.0 - 15.0 g/dL   HCT 08.6 57.8 - 46.9 %  CBG monitoring, ED  Result Value Ref Range   Glucose-Capillary 123 (H) 70 - 99 mg/dL   CT ANGIO HEAD NECK W WO CM  Result Date: 10/31/2022 CLINICAL DATA:  TIA.  Slurred speech in the setting of intoxication. EXAM: CT ANGIOGRAPHY HEAD AND NECK WITH AND  WITHOUT CONTRAST TECHNIQUE: Multidetector CT imaging of the head and neck was  performed using the standard protocol during bolus administration of intravenous contrast. Multiplanar CT image reconstructions and MIPs were obtained to evaluate the vascular anatomy. Carotid stenosis measurements (when applicable) are obtained utilizing NASCET criteria, using the distal internal carotid diameter as the denominator. RADIATION DOSE REDUCTION: This exam was performed according to the departmental dose-optimization program which includes automated exposure control, adjustment of the mA and/or kV according to patient size and/or use of iterative reconstruction technique. CONTRAST:  75mL OMNIPAQUE IOHEXOL 350 MG/ML SOLN COMPARISON:  None Available. FINDINGS: CT HEAD FINDINGS Brain: No evidence of acute infarction, hemorrhage, hydrocephalus, extra-axial collection or mass lesion/mass effect. Vascular: No hyperdense vessel or unexpected calcification. Skull: Normal. Negative for fracture or focal lesion. Sinuses/Orbits: No acute finding. Review of the MIP images confirms the above findings CTA NECK FINDINGS Aortic arch: Mild atheromatous plaque Right carotid system: Minimal plaque at the bifurcation. No stenosis or ulceration. Mild beading to the mid ICA. Left carotid system: Mild plaque at the bifurcation. No stenosis or ulceration Vertebral arteries: Subclavian and vertebral arteries are smoothly contoured and widely patent. Mild calcified plaque at the left vertebral origin. Skeleton: No acute or aggressive finding Other neck: No acute finding Upper chest: Clear apical lungs Review of the MIP images confirms the above findings CTA HEAD FINDINGS Anterior circulation: No significant stenosis, proximal occlusion, aneurysm, or vascular malformation. Posterior circulation: No significant stenosis, proximal occlusion, aneurysm, or vascular malformation. Venous sinuses: Diffusely patent Anatomic variants: None significant Review of the MIP images confirms the above findings IMPRESSION: 1. No emergent vascular  finding. 2. Very mild atheromatous plaque. 3. 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 Electronically Signed   By: Tiburcio Pea M.D.   On: 10/31/2022 04:22     Radiology CT ANGIO HEAD NECK W WO CM  Result Date: 10/31/2022 CLINICAL DATA:  TIA.  Slurred speech in the setting of intoxication. EXAM: CT ANGIOGRAPHY HEAD AND NECK WITH AND WITHOUT CONTRAST TECHNIQUE: Multidetector CT imaging of the head and neck was performed using the standard protocol during bolus administration of intravenous contrast. Multiplanar CT image reconstructions and MIPs were obtained to evaluate the vascular anatomy. Carotid stenosis measurements (when applicable) are obtained utilizing NASCET criteria, using the distal internal carotid diameter as the denominator. RADIATION DOSE REDUCTION: This exam was performed according to the departmental dose-optimization program which includes automated exposure control, adjustment of the mA and/or kV according to patient size and/or use of iterative reconstruction technique. CONTRAST:  75mL OMNIPAQUE IOHEXOL 350 MG/ML SOLN COMPARISON:  None Available. FINDINGS: CT HEAD FINDINGS Brain: No evidence of acute infarction, hemorrhage, hydrocephalus, extra-axial collection or mass lesion/mass effect. Vascular: No hyperdense vessel or unexpected calcification. Skull: Normal. Negative for fracture or focal lesion. Sinuses/Orbits: No acute finding. Review of the MIP images confirms the above findings CTA NECK FINDINGS Aortic arch: Mild atheromatous plaque Right carotid system: Minimal plaque at the bifurcation. No stenosis or ulceration. Mild beading to the mid ICA. Left carotid system: Mild plaque at the bifurcation. No stenosis or ulceration Vertebral arteries: Subclavian and vertebral arteries are smoothly contoured and widely patent. Mild calcified plaque at the left vertebral origin. Skeleton: No acute or aggressive finding Other neck: No acute finding Upper chest: Clear  apical lungs Review of the MIP images confirms the above findings CTA HEAD FINDINGS Anterior circulation: No significant stenosis, proximal occlusion, aneurysm, or vascular malformation. Posterior circulation: No significant stenosis, proximal occlusion, aneurysm, or vascular  malformation. Venous sinuses: Diffusely patent Anatomic variants: None significant Review of the MIP images confirms the above findings IMPRESSION: 1. No emergent vascular finding. 2. Very mild atheromatous plaque. 3. 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 Electronically Signed   By: Tiburcio Pea M.D.   On: 10/31/2022 04:22    Procedures Procedures    Medications Ordered in ED Medications  cefTRIAXone (ROCEPHIN) 1 g in sodium chloride 0.9 % 100 mL IVPB (1 g Intravenous New Bag/Given 10/31/22 0505)  iohexol (OMNIPAQUE) 350 MG/ML injection 75 mL (75 mLs Intravenous Contrast Given 10/31/22 0406)    EKG Interpretation  Date/Time:  Saturday October 31 2022 03:24:01 EDT Ventricular Rate:  83 PR Interval:  242 QRS Duration: 100 QT Interval:  385 QTC Calculation: 453 R Axis:   -13 Text Interpretation: Sinus rhythm Prolonged PR interval Confirmed by Petrea Fredenburg ([id number]) on 10/31/2022 5:25:41 AM        ED Course/ Medical Decision Making/ A&P                             Medical Decision Making Patient fell following drinking a bottle of wine   Problems Addressed: Acute cystitis without hematuria: acute illness or injury    Details: Rocephin in the ED and keflex x 7 days for home  Alcoholic intoxication without complication [facility]): acute illness or injury Fibromuscular dysplasia of carotid artery [facility]):    Details: Follow up with PMD and neurology as an outpatient   Amount and/or Complexity of Data Reviewed Independent Historian: EMS    Details: See above  External Data Reviewed: notes.    Details: Previous outside notes reviewed.   Labs: ordered.    Details: Normal white count 7.5, normal  hemoglobin 13.3, normal platelet count,. URine is positive for UTI. Alcohol elevated 231, normal sodium 141, normal potassium 3.7, normal creatinine.7  Radiology: ordered and independent interpretation performed.    Details: No bleed or acute finding on CT head by me  ECG/medicine tests: ordered and independent interpretation performed. Decision-making details documented in ED Course. Discussion of management or test interpretation with external provider(s): Case d/w Dr. Amada Jupiter of neurology, nothing to do acutely for fibromuscular dyplasia.  Follow up as an outpatient.    Risk Prescription drug management.    Final Clinical Impression(s) / ED Diagnoses Final diagnoses:  Fall, initial encounter  Acute cystitis without hematuria  Alcoholic intoxication without complication (HCC)   Return for intractable cough, coughing up blood, fevers > 100.4 unrelieved by medication, shortness of breath, intractable vomiting, chest pain, shortness of breath, weakness, numbness, changes in speech, facial asymmetry, abdominal pain, passing out, Inability to tolerate liquids or food, cough, altered mental status or any concerns. No signs of systemic illness or infection. The patient is nontoxic-appearing on exam and vital signs are within normal limits.  I have reviewed the triage vital signs and the nursing notes. Pertinent labs & imaging results that were available during my care of the patient were reviewed by me and considered in my medical decision making (see chart for details). After history, exam, and medical workup I feel the patient has been appropriately medically screened and is safe for discharge home. Pertinent diagnoses were discussed with the patient. Patient was given return precautions Rx / DC Orders ED Discharge Orders     None         Mileigh Tilley, MD 10/31/22 6045

## 2022-10-31 NOTE — ED Triage Notes (Signed)
Pt arrive by EMS from home after falling from sitting position, ETOH on board per EMS pt had 6 glasses of wine on a 6 hours period. Per family pt was very confuse with slurred speech after falling. Pt is AO x 4 on arrival to ED with no slurred speech.

## 2022-12-04 ENCOUNTER — Ambulatory Visit (INDEPENDENT_AMBULATORY_CARE_PROVIDER_SITE_OTHER): Payer: 59 | Admitting: Family Medicine

## 2022-12-04 ENCOUNTER — Encounter: Payer: Self-pay | Admitting: Family Medicine

## 2022-12-04 VITALS — BP 130/84 | HR 81 | Temp 98.5°F | Resp 18 | Ht 65.0 in | Wt 202.4 lb

## 2022-12-04 DIAGNOSIS — I773 Arterial fibromuscular dysplasia: Secondary | ICD-10-CM

## 2022-12-04 DIAGNOSIS — N3000 Acute cystitis without hematuria: Secondary | ICD-10-CM

## 2022-12-04 NOTE — Progress Notes (Signed)
Subjective:     Patient ID: Virginia Garner, female    DOB: [birth year], 62 y.o.   MRN: [id number]  Chief Complaint  Patient presents with   Follow-up    ED follow-up on 10/31/22, had a fall  Follow-up on bladder infection Discuss CT results from the hospital     HPI Fall - Presented to the ED on 6/8 after a fall. She reports she was hosting family and friends in her backyard. Tripped and fell backwards after her flip flops got stuck on the lift near her deck. Endorses a hematoma on her arm and slight back pain. Was not planning to go to ED but went at the concern of her son and his friend. She did not have dinner that night and was drinking wine. Does not attribute her fall to her drinking. She notes she occasionally has a bottle of wine on weekends, but not often. Only has had 1 beer in the last month. Denies any LOC.   Fibromuscular dysplasia - Head CT on 6/8 revealed fibromuscular dysplasia at the right cervical ICA. ED advised referral to neurology. Neurology stated this concern is not addressed by their department. She is planning to schedule a referral with cardiology.   UTI - She reports concerns about positive UTI during ED visit. ED advised her to follow up with urology.   Health Maintenance Due  Topic Date Due   HIV Screening  Never done   Hepatitis C Screening  Never done   DTaP/Tdap/Td (1 - Tdap) Never done   PAP SMEAR-Modifier  Never done   Colonoscopy  Never done   MAMMOGRAM  Never done    Past Medical History:  Diagnosis Date   Allergy    Anxiety     Past Surgical History:  Procedure Laterality Date   KNEE SURGERY Right    age 23-loose body   NASAL SEPTUM SURGERY     age 69    No current outpatient medications on file.  No Known Allergies ROS neg/noncontributory except as noted HPI/below      Objective:     BP 130/84   Pulse 81   Temp 98.5 F (36.9 C) (Temporal)   Resp 18   Ht 5\' 5"  (1.651 m)   Wt 202 lb 6 oz (91.8 kg)   SpO2 98%   BMI 33.68  kg/m  Wt Readings from Last 3 Encounters:  12/04/22 202 lb 6 oz (91.8 kg)  10/31/22 200 lb 9.9 oz (91 kg)  09/10/21 200 lb 2 oz (90.8 kg)    Physical Exam   Gen: WDWN NAD HEENT: NCAT, conjunctiva not injected, sclera nonicteric NECK:  supple, no thyromegaly, no nodes, no carotid bruits CARDIAC: RRR, S1S2+, no murmur. DP 2+B. +Tiny bruit on right carotid LUNGS: CTAB. No wheezes ABDOMEN:  BS+, soft, NTND, No HSM, no masses EXT:  no edema MSK: no gross abnormalities.  NEURO: A&O x3.  CN II-XII intact.  PSYCH: normal mood. Good eye contact  Reviewed ER records. Reviewed and updated meds.     Assessment & Plan:  Acute cystitis without hematuria -     POCT Urinalysis Dipstick (Automated)  Fibromuscular dysplasia of carotid artery [facility]) -     Ambulatory referral to Cardiology    Return for soon annual.   I,Rachel Rivera,acting as a scribe for Angelena Sole, MD.,have documented all relevant documentation on the behalf of Angelena Sole, MD,as directed by  Angelena Sole, MD while in the presence of Ruffin Frederick  Ruthine Dose, MD.  I, Isabelle Course, have reviewed all documentation for this visit. The documentation on 12/04/22 for the exam, diagnosis, procedures, and orders are all accurate and complete.  *** Isabelle Course

## 2022-12-04 NOTE — Patient Instructions (Signed)

## 2022-12-07 ENCOUNTER — Other Ambulatory Visit (INDEPENDENT_AMBULATORY_CARE_PROVIDER_SITE_OTHER): Payer: 59

## 2022-12-07 DIAGNOSIS — N3000 Acute cystitis without hematuria: Secondary | ICD-10-CM | POA: Diagnosis not present

## 2022-12-07 LAB — POC URINALSYSI DIPSTICK (AUTOMATED)
Bilirubin, UA: NEGATIVE
Blood, UA: NEGATIVE
Glucose, UA: NEGATIVE
Ketones, UA: NEGATIVE
Nitrite, UA: NEGATIVE
Protein, UA: NEGATIVE
Spec Grav, UA: 1.01 (ref 1.010–1.025)
Urobilinogen, UA: 0.2 E.U./dL
pH, UA: 6.5 (ref 5.0–8.0)

## 2022-12-07 NOTE — Addendum Note (Signed)
Addended by: Angelena Sole on: 12/07/2022 12:24 PM   Modules accepted: Orders

## 2022-12-07 NOTE — Addendum Note (Signed)
Addended by: Lorn Junes on: 12/07/2022 12:50 PM   Modules accepted: Orders

## 2022-12-08 LAB — URINE CULTURE
MICRO NUMBER:: 15200417
SPECIMEN QUALITY:: ADEQUATE

## 2022-12-09 ENCOUNTER — Other Ambulatory Visit: Payer: Self-pay | Admitting: *Deleted

## 2022-12-09 DIAGNOSIS — R82998 Other abnormal findings in urine: Secondary | ICD-10-CM

## 2022-12-22 ENCOUNTER — Telehealth: Payer: Self-pay | Admitting: Family Medicine

## 2022-12-22 NOTE — Telephone Encounter (Signed)
I have spoken with patient in regard. 

## 2022-12-22 NOTE — Telephone Encounter (Signed)
Patient requests to be called. States insurance only allows her to have 1 Physical per year. Patient is concerned that since she has a Physical with her Gynecologist she would not be covered by insurance to have CPE with Dr. Ruthine Dose.

## 2023-01-14 ENCOUNTER — Encounter: Payer: 59 | Admitting: Family Medicine

## 2023-03-03 ENCOUNTER — Other Ambulatory Visit: Payer: Self-pay

## 2023-03-03 ENCOUNTER — Ambulatory Visit (INDEPENDENT_AMBULATORY_CARE_PROVIDER_SITE_OTHER): Payer: 59 | Admitting: Family Medicine

## 2023-03-03 ENCOUNTER — Encounter: Payer: Self-pay | Admitting: Family Medicine

## 2023-03-03 VITALS — BP 136/84 | HR 90 | Temp 98.3°F | Resp 18 | Ht 65.0 in | Wt 205.5 lb

## 2023-03-03 DIAGNOSIS — Z1159 Encounter for screening for other viral diseases: Secondary | ICD-10-CM | POA: Diagnosis not present

## 2023-03-03 DIAGNOSIS — Z0000 Encounter for general adult medical examination without abnormal findings: Secondary | ICD-10-CM | POA: Diagnosis not present

## 2023-03-03 DIAGNOSIS — R82998 Other abnormal findings in urine: Secondary | ICD-10-CM | POA: Diagnosis not present

## 2023-03-03 LAB — COMPREHENSIVE METABOLIC PANEL
ALT: 16 U/L (ref 0–35)
AST: 19 U/L (ref 0–37)
Albumin: 4.2 g/dL (ref 3.5–5.2)
Alkaline Phosphatase: 99 U/L (ref 39–117)
BUN: 10 mg/dL (ref 6–23)
CO2: 27 meq/L (ref 19–32)
Calcium: 9.1 mg/dL (ref 8.4–10.5)
Chloride: 104 meq/L (ref 96–112)
Creatinine, Ser: 0.53 mg/dL (ref 0.40–1.20)
GFR: 99.76 mL/min (ref >60.00)
Glucose, Bld: 98 mg/dL (ref 70–99)
Potassium: 3.7 meq/L (ref 3.5–5.1)
Sodium: 139 meq/L (ref 135–145)
Total Bilirubin: 0.5 mg/dL (ref 0.2–1.2)
Total Protein: 7 g/dL (ref 6.0–8.3)

## 2023-03-03 LAB — LIPID PANEL
Cholesterol: 184 mg/dL (ref 0–200)
HDL: 56.9 mg/dL (ref >39.00)
LDL Cholesterol: 106 mg/dL — ABNORMAL HIGH (ref 0–99)
NonHDL: 127.35
Total CHOL/HDL Ratio: 3
Triglycerides: 106 mg/dL (ref 0.0–149.0)
VLDL: 21.2 mg/dL (ref 0.0–40.0)

## 2023-03-03 LAB — HEMOGLOBIN A1C: Hgb A1c MFr Bld: 5.5 % (ref 4.6–6.5)

## 2023-03-03 LAB — CBC WITH DIFFERENTIAL/PLATELET
Basophils Absolute: 0 10*3/uL (ref 0.0–0.1)
Basophils Relative: 0.9 % (ref 0.0–3.0)
Eosinophils Absolute: 0.1 10*3/uL (ref 0.0–0.7)
Eosinophils Relative: 2.3 % (ref 0.0–5.0)
HCT: 40.5 % (ref 36.0–46.0)
Hemoglobin: 13.3 g/dL (ref 12.0–15.0)
Lymphocytes Relative: 30.8 % (ref 12.0–46.0)
Lymphs Abs: 1.7 10*3/uL (ref 0.7–4.0)
MCHC: 32.8 g/dL (ref 30.0–36.0)
MCV: 90.3 fL (ref 78.0–100.0)
Monocytes Absolute: 0.4 10*3/uL (ref 0.1–1.0)
Monocytes Relative: 6.4 % (ref 3.0–12.0)
Neutro Abs: 3.3 10*3/uL (ref 1.4–7.7)
Neutrophils Relative %: 59.6 % (ref 43.0–77.0)
Platelets: 307 10*3/uL (ref 150.0–400.0)
RBC: 4.49 Mil/uL (ref 3.87–5.11)
RDW: 14.5 % (ref 11.5–15.5)
WBC: 5.6 10*3/uL (ref 4.0–10.5)

## 2023-03-03 LAB — URINALYSIS, ROUTINE W REFLEX MICROSCOPIC
Bilirubin Urine: NEGATIVE
Hgb urine dipstick: NEGATIVE
Ketones, ur: NEGATIVE
Leukocytes,Ua: NEGATIVE
Nitrite: NEGATIVE
Specific Gravity, Urine: <=1.005 — AB (ref 1.000–1.030)
Total Protein, Urine: NEGATIVE
Urine Glucose: NEGATIVE
Urobilinogen, UA: 0.2 (ref 0.0–1.0)
pH: 6.5 (ref 5.0–8.0)

## 2023-03-03 LAB — TSH: TSH: 1.61 u[IU]/mL (ref 0.35–5.50)

## 2023-03-03 NOTE — Addendum Note (Signed)
Addended by: Lorn Junes on: 03/03/2023 10:43 AM   Modules accepted: Orders

## 2023-03-03 NOTE — Patient Instructions (Addendum)
It was very nice to see you today! Look for Tdap records.     PLEASE NOTE:  If you had any lab tests please let us know if you have not heard back within a few days. You may see your results on MyChart before we have a chance to review them but we will give you a call once they are reviewed by [place]. If we ordered any referrals today, please let us know if you have not heard from their office within the next week.   Please try these tips to maintain a healthy lifestyle:  Eat most of your calories during the day when you are active. Eliminate processed foods including packaged sweets (pies, cakes, cookies), reduce intake of potatoes, white bread, white pasta, and white rice. Look for whole grain options, oat flour or almond flour.  Each meal should contain half fruits/vegetables, one quarter protein, and one quarter carbs (no bigger than a computer mouse).  Cut down on sweet beverages. This includes juice, soda, and sweet tea. Also watch fruit intake, though this is a healthier sweet option, it still contains natural sugar! Limit to 3 servings daily.  Drink at least 1 glass of water with each meal and aim for at least 8 glasses per day  Exercise at least 150 minutes every week.

## 2023-03-03 NOTE — Progress Notes (Signed)
Phone [phone]   Subjective:   Patient is a 62 y.o. female presenting for annual physical.    Chief Complaint  Patient presents with   Annual Exam    CPE Fasting, black coffee and water Pap scheduled for 03/17/23   Annual - Pt states she has not been exercising due to increased work load.  Thinks Tdap 2019-2020.  Pap and mamm at gyn later this month.    Irritable Bowels- Pt states she has struggled with intermittent  irritable bowels since her early 20's due to anxiety. She reports that her anxiety has increased over the past 2 weeks due to the [place] in [place] and [place] affecting her friends and family.   See problem oriented charting- ROS- ROS: Gen: no fever, chills  Skin: no rash, itching ENT: no ear pain, ear drainage, nasal congestion, rhinorrhea, sinus pressure, sore throat Eyes: no blurry vision, double vision Resp: no cough, wheeze,SOB CV: no CP, palpitations, LE edema,  GI: no heartburn, n/v//c, abd pain GU: no dysuria, urgency, frequency, hematuria MSK: no, myalgias, back pain, +joint pain-knees-chronic Neuro: no dizziness, headache, weakness, vertigo Psych: no depression, anxiety, insomnia, SI   The following were reviewed and entered/updated in epic: Past Medical History:  Diagnosis Date   Acute cystitis without hematuria    Allergy    Anxiety    Fibromuscular dysplasia of carotid artery (HCC)    There are no problems to display for this patient.  Past Surgical History:  Procedure Laterality Date   KNEE SURGERY Right    age 52-loose body   NASAL SEPTUM SURGERY     age 83    Family History  Problem Relation Age of Onset   Hearing loss Mother    Depression Mother    Arthritis Mother    Cancer Father    Arthritis Father    Prostate cancer Father    Cancer Maternal Grandfather    Stomach cancer Maternal Grandfather    Heart disease Paternal Grandmother    Heart disease Paternal Grandfather     Medications- reviewed and updated No  current outpatient medications on file.   No current facility-administered medications for this visit.    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 DP 2+B LUNGS: CTAB. No wheezes ABDOMEN:  BS+, soft, NTND, No HSM, no masses EXT:  no edema MSK: no gross abnormalities. MS 5/5 all 4 NEURO: A&O x3.  CN II-XII intact.  PSYCH: normal mood. Good eye contact    Assessment and Plan   Health Maintenance counseling: 1. Anticipatory guidance: Patient counseled regarding regular dental exams q6 months, eye exams,  avoiding smoking and second hand smoke, limiting alcohol to 1 beverage per day, no illicit drugs.   2. Risk factor reduction:  Advised patient of need for regular exercise and diet rich and fruits and vegetables to reduce risk of heart attack and stroke.  Exercise- Not regularly exercising .  Wt Readings from Last 3 Encounters:  03/03/23 205 lb 8 oz (93.2 kg)  12/04/22 202 lb 6 oz (91.8 kg)  10/31/22 200 lb 9.9 oz (91 kg)   3. Immunizations/screenings/ancillary studies Immunization History  Administered Date(s) Administered   Influenza Inj Mdck Quad With Preservative 05/23/2018   Influenza-Unspecified 03/08/2021   PFIZER(Purple Top)SARS-COV-2 Vaccination 07/21/2019, 08/11/2019, 01/20/2020   Health Maintenance Due  Topic Date Due   HIV Screening  Never done   Hepatitis C Screening  Never done   DTaP/Tdap/Td (1 - Tdap) Never done   Cervical Cancer Screening (HPV/Pap Cotest)  Never done   MAMMOGRAM   Never done    4. Cervical cancer screening- scheduled for 03/17/23  5. Breast cancer screening-  mammogram never done 6. Colon cancer screening - declined, states she will consider.  Declined cologard as considering cscope 7. Skin cancer screening- advised regular sunscreen use. Denies worrisome, changing, or new skin lesions.  8. Birth control/STD check- n/a 9. Osteoporosis screening- never done 10. Smoking associated screening - non smoker  Wellness examination -     CBC with Differential/Platelet -     Comprehensive metabolic panel -     Lipid panel -     TSH -     Hemoglobin A1c -     Hepatitis C antibody -     HIV Antibody (routine testing w rflx)  Screening for viral disease -     Hepatitis C antibody -     HIV Antibody (routine testing w rflx)   Wellness-anticipatory guidance.  Work on Diet/Exercise  Check CBC,CMP,lipids,TSH, A1C.  F/u 1 yr  Recommended follow up: Return in about 1 year (around 03/02/2024) for annual physical.  Lab/Order associations: fasting - black coffee  I,Anaiya N Rice,acting as a scribe for Angelena Sole, MD.,have documented all relevant documentation on the behalf of Angelena Sole, MD,as directed by  Angelena Sole, MD while in the presence of Angelena Sole, MD.  I, Angelena Sole, MD, have reviewed all documentation for this visit. The documentation on 03/03/23 for the exam, diagnosis, procedures, and orders are all accurate and complete.   Angelena Sole, MD

## 2023-03-04 LAB — HEPATITIS C ANTIBODY: Hepatitis C Ab: NONREACTIVE

## 2023-03-04 LAB — HIV ANTIBODY (ROUTINE TESTING W REFLEX): HIV 1&2 Ab, 4th Generation: NONREACTIVE

## 2023-03-04 NOTE — Progress Notes (Signed)
Labs great

## 2023-03-05 ENCOUNTER — Encounter (HOSPITAL_BASED_OUTPATIENT_CLINIC_OR_DEPARTMENT_OTHER): Payer: Self-pay | Admitting: Cardiology

## 2023-03-05 ENCOUNTER — Ambulatory Visit (INDEPENDENT_AMBULATORY_CARE_PROVIDER_SITE_OTHER): Payer: 59 | Admitting: Cardiology

## 2023-03-05 VITALS — BP 130/84 | HR 97 | Ht 64.5 in | Wt 207.5 lb

## 2023-03-05 DIAGNOSIS — E66812 Obesity, class 2: Secondary | ICD-10-CM | POA: Diagnosis not present

## 2023-03-05 DIAGNOSIS — Z6835 Body mass index (BMI) 35.0-35.9, adult: Secondary | ICD-10-CM

## 2023-03-05 DIAGNOSIS — Z713 Dietary counseling and surveillance: Secondary | ICD-10-CM | POA: Diagnosis not present

## 2023-03-05 DIAGNOSIS — Z7189 Other specified counseling: Secondary | ICD-10-CM | POA: Diagnosis not present

## 2023-03-05 DIAGNOSIS — I773 Arterial fibromuscular dysplasia: Secondary | ICD-10-CM | POA: Diagnosis not present

## 2023-03-05 DIAGNOSIS — E6609 Other obesity due to excess calories: Secondary | ICD-10-CM

## 2023-03-05 DIAGNOSIS — I7090 Unspecified atherosclerosis: Secondary | ICD-10-CM

## 2023-03-05 NOTE — Patient Instructions (Addendum)
Medication Instructions:  Your physician recommends that you continue on your current medications as directed. Please refer to the Current Medication list given to you today.  *If you need a refill on your cardiac medications before your next appointment, please call your pharmacy*  Lab Work: NONE  Testing/Procedures: Your physician has requested that you have a renal artery duplex. During this test, an ultrasound is used to evaluate blood flow to the kidneys. Allow one hour for this exam. Do not eat after midnight the day before and avoid carbonated beverages. Take your medications as you usually do.  Your physician has requested that you have a carotid duplex. This test is an ultrasound of the carotid arteries in your neck. It looks at blood flow through these arteries that supply the brain with blood. Allow one hour for this exam. There are no restrictions or special instructions. TO BE DONE JUNE 2025   Follow-Up: At [facility], you and your health needs are our priority.  As part of our continuing mission to provide you with exceptional heart care, we have created designated Provider Care Teams.  These Care Teams include your primary Cardiologist (physician) and Advanced Practice Providers (APPs -  Physician Assistants and Nurse Practitioners) who all work together to provide you with the care you need, when you need it.  We recommend signing up for the patient portal called "MyChart".  Sign up information is provided on this After Visit Summary.  MyChart is used to connect with patients for Virtual Visits (Telemedicine).  Patients are able to view lab/test results, encounter notes, upcoming appointments, etc.  Non-urgent messages can be sent to your provider as well.   To learn more about what you can do with MyChart, go to [email or web address].    Your next appointment:   12 month(s)  The format for your next appointment:   In Person  Provider:   Jodelle Red,  MD      Recent data, summarized from NPR from a study from the [facility]:  CropWizard.com.pt  "Researchers at the [facility] set out to answer this question by comparing statins to supplements in a clinical trial. They tracked the outcomes of 190 adults, ages 76 to 1. Some participants were given a 5 mg daily dose of rosuvastatin, a statin that is sold under the brand name Crestor for 28 days. Others were given supplements, including fish oil, cinnamon, garlic, turmeric, plant sterols or red yeast rice for the same period."  "What we found was that rosuvastatin lowered LDL cholesterol by almost 16% and that was vastly superior to placebo and any of the six supplements studied in the trial," study Kayren Eaves, M.D. of the [facility] told NPR. He says this level of reduction is enough to lower the risk of heart attacks and strokes. The findings are published in the Journal of the [employer] of Cardiology.  "Oftentimes these supplements are marketed as 'natural ways' to lower your cholesterol," says Laffin. But he says none of the dietary supplements demonstrated any significant decrease in LDL cholesterol compared with a placebo. LDL cholesterol is considered the 'bad cholesterol' because it can contribute to plaque build-up in the artery walls - which can narrow the arteries, and set the stage for heart attacks and strokes"

## 2023-03-05 NOTE — Progress Notes (Signed)
Cardiology Office Note:  .    Date:  03/05/2023  ID:  Erick Alley, DOB [birth year], MRN [id number] PCP: Jeani Sow, MD  [facility] Providers Cardiologist:  Jodelle Red, MD     History of Present Illness: .    Virginia Garner is a 62 y.o. female with a hx of arterial fibromuscular dysplasia, here for the evaluation of fibromuscular dysplasia of carotid artery at the request of Dr. Jeani Sow. She was seen by her PCP 12/04/2022 for follow-up after presenting to the ED 10/31/22 due to a mechanical fall. 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 Incidentally found to be positive for UTI at that time. She was referred to cardiology for further evaluation.  Cardiovascular risk factors: Prior clinical ASCVD: Mild atheromatous plaque on head CT 10/31/22. If she feels very anxious, she may feel jittery. In the past during pregnancy she may have felt some irregular heartbeats. Normally she denies significant palpitations. Comorbid conditions:  None. Metabolic syndrome/Obesity:  Highest adult weight 212 lbs. Current weight 207 lbs. Previously closer to 180 lbs. Chronic inflammatory conditions:  None. Tobacco use history:  Never. Family history:  Her grandmother had a stroke and died at age 48. Her other grandmother may have had a heart attack, died at 61 yo. Her father died of prostate cancer at age 54. Her oldest son was born with transposition which required surgical correction. Prior pertinent testing and/or incidental findings: Head CT 10/31/22 as above. Exercise level: She complains of knee pain that she feels is related to her weight. For exercise she has been trying to increase her walking.  Current diet: Trying to follow a more plant-based diet.   She denies any chest pain, shortness of breath, peripheral edema, lightheadedness, headaches, syncope, orthopnea, or PND.  ROS:  Please see the history of present illness. ROS  otherwise negative except as noted.  (+) Environmental allergies (+) Bilateral knee pain  Studies Reviewed: Marland Kitchen         CTA Head/Neck  10/31/2022: CTA NECK FINDINGS   Aortic arch: Mild atheromatous plaque   Right carotid system: Minimal plaque at the bifurcation. No stenosis or ulceration. Mild beading to the mid ICA.   Left carotid system: Mild plaque at the bifurcation. No stenosis or ulceration   Vertebral arteries: Subclavian and vertebral arteries are smoothly contoured and widely patent. Mild calcified plaque at the left vertebral origin.  IMPRESSION: 1. No emergent vascular finding. 2. Very mild atheromatous plaque. 3. 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    Physical Exam:    VS:  BP 130/84 (BP Location: Left Arm, Patient Position: Sitting, Cuff Size: Large)   Pulse 97   Ht 5' 4.5" (1.638 m)   Wt 207 lb 8 oz (94.1 kg)   SpO2 95%   BMI 35.07 kg/m    Wt Readings from Last 3 Encounters:  03/05/23 207 lb 8 oz (94.1 kg)  03/03/23 205 lb 8 oz (93.2 kg)  12/04/22 202 lb 6 oz (91.8 kg)    GEN: Well nourished, well developed in no acute distress HEENT: Normal, moist mucous membranes NECK: No JVD CARDIAC: regular rhythm, normal S1 and S2, no rubs or gallops. No murmur. VASCULAR: Radial and DP pulses 2+ bilaterally. No carotid bruits RESPIRATORY:  Clear to auscultation without rales, wheezing or rhonchi  ABDOMEN: Soft, non-tender, non-distended MUSCULOSKELETAL:  Ambulates independently SKIN: Warm and dry, no edema NEUROLOGIC:  Alert and oriented x  3. No focal neuro deficits noted. PSYCHIATRIC:  Normal affect   ASSESSMENT AND PLAN: .    Fibromuscular dysplasia -incidental finding of R ICA -discussed CTA chest/abdomen pelvis for screening vs. Vascular doppler of renals given frequency of renal FMD. She would like to avoid radiation/dye, prefers ultrasound -will get vasc doppler of renals now, vasc carotid dopplers 1 year from her CT  (10/2022) -reviewed red flag warning signs that need immediate medical attention  Atherosclerosis of aorta and other arteries -discussed recommendation for statin. She is changing diet, aiming for plant based. She would like to avoid medications at this time  CV risk counseling and prevention Obesity, BMI 35 -recommend heart healthy/Mediterranean diet, with whole grains, fruits, vegetable, fish, lean meats, nuts, and olive oil. Limit salt. -recommend moderate walking, 3-5 times/week for 30-50 minutes each session. Aim for at least 150 minutes.week. Goal should be pace of 3 miles/hours, or walking 1.5 miles in 30 minutes -recommend avoidance of tobacco products. Avoid excess alcohol. -ASCVD risk score: The 10-year ASCVD risk score (Arnett DK, et al., 2019) is: 3.5%   Values used to calculate the score:     Age: 69 years     Sex: Female     Is Non-Hispanic African American: No     Diabetic: No     Tobacco smoker: No     Systolic Blood Pressure: 130 mmHg     Is BP treated: No     HDL Cholesterol: 56.9 mg/dL     Total Cholesterol: 184 mg/dL    Dispo: Follow-up in 1 year, or sooner as needed.  I,Mathew Stumpf,acting as a Neurosurgeon for [employer], MD.,have documented all relevant documentation on the behalf of Jodelle Red, MD,as directed by  Jodelle Red, MD while in the presence of Jodelle Red, MD.  I, Jodelle Red, MD, have reviewed all documentation for this visit. The documentation on 03/05/23 for the exam, diagnosis, procedures, and orders are all accurate and complete.   Signed, Jodelle Red, MD

## 2023-03-18 ENCOUNTER — Encounter: Payer: Self-pay | Admitting: Obstetrics and Gynecology

## 2023-03-18 ENCOUNTER — Other Ambulatory Visit (HOSPITAL_COMMUNITY)
Admission: RE | Admit: 2023-03-18 | Discharge: 2023-03-18 | Disposition: A | Discharge status: Home / Self Care | Payer: 59 | Source: Ambulatory Visit | Attending: Obstetrics and Gynecology | Admitting: Obstetrics and Gynecology

## 2023-03-18 ENCOUNTER — Ambulatory Visit: Payer: 59 | Admitting: Obstetrics and Gynecology

## 2023-03-18 VITALS — BP 138/80 | HR 89 | Ht 65.35 in | Wt 207.0 lb

## 2023-03-18 DIAGNOSIS — Z01419 Encounter for gynecological examination (general) (routine) without abnormal findings: Secondary | ICD-10-CM

## 2023-03-18 DIAGNOSIS — Z1211 Encounter for screening for malignant neoplasm of colon: Secondary | ICD-10-CM

## 2023-03-19 NOTE — Progress Notes (Signed)
62 y.o. y.o. female here for annual exam. She denies any PM bleeding.   No LMP recorded. Patient is postmenopausal.    Blood pressure 138/80, pulse 89, height 5' 5.35" (1.66 m), weight 207 lb (93.9 kg), SpO2 98%.  No results found for: "DIAGPAP", "HPVHIGH", "ADEQPAP"  GYN HISTORY: No results found for: "DIAGPAP", "HPVHIGH", "ADEQPAP"  OB History  Gravida Para Term Preterm AB Living  3 3 3     3   SAB IAB Ectopic Multiple Live Births          3    # Outcome Date GA Lbr Len/2nd Weight Sex Type Anes PTL Lv  3 Term     M Vag-Spont   LIV  2 Term     F Vag-Spont   LIV  1 Term     M Vag-Spont   LIV    Past Medical History:  Diagnosis Date   Acute cystitis without hematuria    Allergy    Anxiety    Fibromuscular dysplasia of carotid artery (HCC)     Past Surgical History:  Procedure Laterality Date   KNEE SURGERY Right    age 69-loose body   NASAL SEPTUM SURGERY     age 45    Current Outpatient Medications on File Prior to Visit  Medication Sig Dispense Refill   Thiamine HCl (VITAMIN B-1 PO) Take by mouth.     VITAMIN D PO Take by mouth.     VITAMIN E PO Take by mouth.     No current facility-administered medications on file prior to visit.    Social History   Socioeconomic History   Marital status: Married    Spouse name: Not on file   Number of children: 3   Years of education: Not on file   Highest education level: Not on file  Occupational History   Not on file  Tobacco Use   Smoking status: Never   Smokeless tobacco: Never  Vaping Use   Vaping status: Never Used  Substance and Sexual Activity   Alcohol use: Yes    Alcohol/week: 6.0 standard drinks of alcohol    Types: 6 Glasses of wine per week    Comment: sociallly   Drug use: Never   Sexual activity: Yes    Partners: Male  Other Topics Concern   Not on file  Social History Narrative   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   Social Determinants of Health   Financial Resource Strain:  Not on file  Food Insecurity: Not on file  Transportation Needs: Not on file  Physical Activity: Not on file  Stress: Not on file  Social Connections: Not on file  Intimate Partner Violence: Not on file    Family History  Problem Relation Age of Onset   Hearing loss Mother    Depression Mother    Arthritis Mother    Cancer Father    Arthritis Father    Prostate cancer Father    Cancer Maternal Grandfather    Stomach cancer Maternal Grandfather    Heart disease Paternal Grandmother    Heart disease Paternal Grandfather      No Known Allergies    Patient's last menstrual period was No LMP recorded. Patient is postmenopausal..           Review of Systems Alls systems reviewed and are negative.     Physical Exam Constitutional:      Appearance: Normal appearance.  Genitourinary:     Vulva  and urethral meatus normal.     No lesions in the vagina.     Right Labia: No rash, lesions or skin changes.    Left Labia: No lesions, skin changes or rash.    No vaginal discharge or tenderness.     No vaginal prolapse present.    No vaginal atrophy present.     Right Adnexa: not tender, not palpable and no mass present.    Left Adnexa: not tender, not palpable and no mass present.    No cervical motion tenderness or discharge.     Uterus is not enlarged, tender or irregular.     Uterus is anteverted.  Breasts:    Right: Normal.     Left: Normal.  HENT:     Head: Normocephalic.  Neck:     Thyroid: No thyroid mass, thyromegaly or thyroid tenderness.  Cardiovascular:     Rate and Rhythm: Normal rate and regular rhythm.     Heart sounds: Normal heart sounds, S1 normal and S2 normal.  Pulmonary:     Effort: Pulmonary effort is normal.     Breath sounds: Normal breath sounds and air entry.  Abdominal:     General: There is no distension.     Palpations: Abdomen is soft. There is no mass.     Tenderness: There is no abdominal tenderness. There is no guarding or rebound.   Musculoskeletal:        General: Normal range of motion.     Cervical back: Full passive range of motion without pain, normal range of motion and neck supple. No tenderness.     Right lower leg: No edema.     Left lower leg: No edema.  Neurological:     Mental Status: She is alert.  Skin:    General: Skin is warm.  Psychiatric:        Mood and Affect: Mood normal.        Behavior: Behavior normal.        Thought Content: Thought content normal.  Vitals and nursing note reviewed. Exam conducted with a chaperone present.       A:         Well Woman GYN exam                             P:        Pap smear collected today Encouraged annual mammogram screening Colon cancer screening referral placed today  Labs and immunizations to do with PMD Discussed breast self exams Encouraged healthy lifestyle practices Encouraged Vit D and Calcium   No follow-ups on file.  Earley Favor

## 2023-03-22 LAB — CYTOLOGY - PAP
Comment: NEGATIVE
Diagnosis: NEGATIVE
High risk HPV: NEGATIVE

## 2023-04-01 ENCOUNTER — Ambulatory Visit (HOSPITAL_BASED_OUTPATIENT_CLINIC_OR_DEPARTMENT_OTHER): Payer: 59

## 2023-04-01 DIAGNOSIS — I773 Arterial fibromuscular dysplasia: Secondary | ICD-10-CM

## 2023-10-27 ENCOUNTER — Ambulatory Visit (HOSPITAL_BASED_OUTPATIENT_CLINIC_OR_DEPARTMENT_OTHER): Payer: 59

## 2023-10-27 DIAGNOSIS — I7090 Unspecified atherosclerosis: Secondary | ICD-10-CM

## 2023-10-27 DIAGNOSIS — I773 Arterial fibromuscular dysplasia: Secondary | ICD-10-CM | POA: Diagnosis not present

## 2023-11-05 ENCOUNTER — Ambulatory Visit (HOSPITAL_BASED_OUTPATIENT_CLINIC_OR_DEPARTMENT_OTHER): Payer: Self-pay | Admitting: Cardiology

## 2024-03-08 ENCOUNTER — Encounter: Payer: 59 | Admitting: Family Medicine

## 2024-03-16 ENCOUNTER — Encounter: Payer: Self-pay | Admitting: *Deleted

## 2024-03-16 ENCOUNTER — Other Ambulatory Visit: Payer: Self-pay | Admitting: Obstetrics and Gynecology

## 2024-03-16 DIAGNOSIS — Z1231 Encounter for screening mammogram for malignant neoplasm of breast: Secondary | ICD-10-CM

## 2024-05-11 ENCOUNTER — Telehealth: Payer: Self-pay

## 2024-05-11 NOTE — Telephone Encounter (Signed)
 Copied from CRM #[id number]. Topic: Appointments - Scheduling Inquiry for Clinic >> May 11, 2024 11:53 AM Jasmin G wrote: Reason for CRM: Pt requested, if possible for her appt on Dec 24th to be changed to a Physical. Pt states that she recently lost her job and will not be covered by her Insurance after the end of the year, so she would like to take care of that if possible. Pt requested a call back at [phone] to be informed of outcome.

## 2024-05-11 NOTE — Telephone Encounter (Signed)
 Pt is now requesting to have a physical done during this appt also. Stating insurance coverage is ending on 05/24/24. This would put provider over session limits. Please advise

## 2024-05-11 NOTE — Telephone Encounter (Signed)
 What labs need to be ordered?  Copied from CRM #[id number]. Topic: Clinical - Request for Lab/Test Order >> May 10, 2024  4:33 PM Virginia Garner wrote: Reason for CRM: patient is asking for lab orders before appt- [phone]

## 2024-05-17 ENCOUNTER — Encounter: Payer: Self-pay | Admitting: Family Medicine

## 2024-05-17 ENCOUNTER — Ambulatory Visit: Admitting: Family Medicine

## 2024-05-17 VITALS — BP 138/84 | HR 82 | Temp 97.9°F | Ht 65.35 in | Wt 201.0 lb

## 2024-05-17 DIAGNOSIS — Z0000 Encounter for general adult medical examination without abnormal findings: Secondary | ICD-10-CM | POA: Diagnosis not present

## 2024-05-17 LAB — LIPID PANEL
Cholesterol: 201 mg/dL — ABNORMAL HIGH (ref 28–200)
HDL: 58.1 mg/dL
LDL Cholesterol: 126 mg/dL — ABNORMAL HIGH (ref 10–99)
NonHDL: 143.35
Total CHOL/HDL Ratio: 3
Triglycerides: 88 mg/dL (ref 10.0–149.0)
VLDL: 17.6 mg/dL (ref 0.0–40.0)

## 2024-05-17 LAB — CBC WITH DIFFERENTIAL/PLATELET
Basophils Absolute: 0 K/uL (ref 0.0–0.1)
Basophils Relative: 0.8 % (ref 0.0–3.0)
Eosinophils Absolute: 0.1 K/uL (ref 0.0–0.7)
Eosinophils Relative: 2.8 % (ref 0.0–5.0)
HCT: 39.8 % (ref 36.0–46.0)
Hemoglobin: 13.2 g/dL (ref 12.0–15.0)
Lymphocytes Relative: 30.5 % (ref 12.0–46.0)
Lymphs Abs: 1.5 K/uL (ref 0.7–4.0)
MCHC: 33.1 g/dL (ref 30.0–36.0)
MCV: 90.4 fl (ref 78.0–100.0)
Monocytes Absolute: 0.2 K/uL (ref 0.1–1.0)
Monocytes Relative: 4.8 % (ref 3.0–12.0)
Neutro Abs: 3.1 K/uL (ref 1.4–7.7)
Neutrophils Relative %: 61.1 % (ref 43.0–77.0)
Platelets: 279 K/uL (ref 150.0–400.0)
RBC: 4.41 Mil/uL (ref 3.87–5.11)
RDW: 13.3 % (ref 11.5–15.5)
WBC: 5.1 K/uL (ref 4.0–10.5)

## 2024-05-17 LAB — COMPREHENSIVE METABOLIC PANEL WITH GFR
ALT: 16 U/L (ref 3–35)
AST: 18 U/L (ref 5–37)
Albumin: 4.1 g/dL (ref 3.5–5.2)
Alkaline Phosphatase: 82 U/L (ref 39–117)
BUN: 10 mg/dL (ref 6–23)
CO2: 28 meq/L (ref 19–32)
Calcium: 8.8 mg/dL (ref 8.4–10.5)
Chloride: 104 meq/L (ref 96–112)
Creatinine, Ser: 0.59 mg/dL (ref 0.40–1.20)
GFR: 96.39 mL/min
Glucose, Bld: 95 mg/dL (ref 70–99)
Potassium: 4 meq/L (ref 3.5–5.1)
Sodium: 139 meq/L (ref 135–145)
Total Bilirubin: 0.5 mg/dL (ref 0.2–1.2)
Total Protein: 7.3 g/dL (ref 6.0–8.3)

## 2024-05-17 LAB — HEMOGLOBIN A1C: Hgb A1c MFr Bld: 5.7 % (ref 4.6–6.5)

## 2024-05-17 LAB — TSH: TSH: 1.65 u[IU]/mL (ref 0.35–5.50)

## 2024-05-17 NOTE — Progress Notes (Signed)
 " Phone [phone]   Subjective:   Patient is a 63 y.o. female presenting for annual physical.    Chief Complaint  Patient presents with   Annual Exam    Pt is here for CPE    Discussed the use of AI scribe software for clinical note transcription with the patient, who gave verbal consent to proceed.  History of Present Illness Virginia Garner is a 63 year old female who presents for an annual physical exam.  She has not had any new surgeries or changes in her family medical history since her last visit a year ago. She does not use drugs, drinks alcohol minimally, mostly on weekends, and has never smoked or vaped.  She was recently laid off from her job in [employer] division of a company in [place], which was unexpected and has impacted her financially. She is currently looking for a new job and is considering her options for health insurance, including COBRA, which she finds expensive.  She is currently taking only vitamins and has started walking regularly for exercise, occasionally going to the gym with her son to do weights. She has not scheduled a mammogram yet. No major headaches, dizziness, passing out, blurry vision, or double vision. No chest pain, heart racing, skipping beats, coughing, wheezing, shortness of breath, nausea, vomiting, diarrhea, constipation, heartburn, trouble urinating, or vaginal bleeding. She mentions knee pain.  She has been feeling down due to her job loss but was not experiencing these feelings prior to the layoff. She is actively seeking employment and trying to maintain a routine to stay busy.  She had a prolonged illness over the summer lasting six weeks, which she suspects might have been a form of COVID-19, but she did not test for it. She is allergic to trees and pollen, which is exacerbated by having a real Christmas tree, but she manages.  She has not had a colonoscopy yet. She has received one shingles vaccine and plans to get the second dose. She  has not yet received her COVID-19 booster or flu shot but intends to do so after the holidays.  She is concerned about her thyroid function due to symptoms like thinning hair and nails, and weight issues. She is considering taking pumpkin seed oil as a supplement.    See problem oriented charting- ROS- ROS: Gen: no fever, chills  Skin: no rash, itching ENT: no ear pain, ear drainage, nasal congestion, rhinorrhea, sinus pressure, sore throat Eyes: no blurry vision, double vision Resp: no cough, wheeze,SOB CV: no CP, palpitations, LE edema,  GI: no heartburn, n/v/d/c, abd pain GU: no dysuria, urgency, frequency, hematuria MSK: chronic knees Neuro: no dizziness, headache, weakness, vertigo Psych: no depression, anxiety, insomnia, SI   The following were reviewed and entered/updated in epic: Past Medical History:  Diagnosis Date   Acute cystitis without hematuria    Allergy    Anxiety    Fibromuscular dysplasia of carotid artery    There are no active problems to display for this patient.  Past Surgical History:  Procedure Laterality Date   KNEE SURGERY Right    age 72-loose body   NASAL SEPTUM SURGERY     age 89    Family History  Problem Relation Age of Onset   Hearing loss Mother    Depression Mother    Arthritis Mother    Cancer Father    Arthritis Father    Prostate cancer Father    Cancer Maternal Grandfather    Stomach cancer  Maternal Grandfather    Heart disease Paternal Grandmother    Heart disease Paternal Grandfather     Medications- reviewed and updated Current Outpatient Medications  Medication Sig Dispense Refill   Thiamine HCl (VITAMIN B-1 PO) Take by mouth.     VITAMIN D PO Take by mouth.     VITAMIN E PO Take by mouth.     No current facility-administered medications for this visit.    Allergies-reviewed and updated Allergies[1]  Social History   Social History Narrative   Works from home [employer]-just laid off 2025 Dec   Objective   Objective:  BP 138/84 (BP Location: Left Arm, Patient Position: Sitting, Cuff Size: Large)   Pulse 82   Temp 97.9 F (36.6 C) (Temporal)   Ht 5' 5.35 (1.66 m)   Wt 201 lb (91.2 kg)   SpO2 98%   BMI 33.09 kg/m  Physical Exam  Gen: WDWN NAD HEENT: NCAT, conjunctiva not injected, sclera nonicteric TM WNL B, OP moist, no exudates  NECK:  supple, no thyromegaly, no nodes, no carotid bruits CARDIAC: RRR, S1S2+, no murmur. DP 2+B LUNGS: CTAB. No wheezes ABDOMEN:  BS+, soft, NTND, No HSM, no masses EXT:  no edema MSK: no gross abnormalities. MS 5/5 all 4 NEURO: A&O x3.  CN II-XII intact.  PSYCH: normal mood. Good eye contact     Assessment and Plan   Health Maintenance counseling: 1. Anticipatory guidance: Patient counseled regarding regular dental exams q6 months, eye exams,  avoiding smoking and second hand smoke, limiting alcohol to 1 beverage per day, no illicit drugs.   2. Risk factor reduction:  Advised patient of need for regular exercise and diet rich and fruits and vegetables to reduce risk of heart attack and stroke. Exercise- walking.  Wt Readings from Last 3 Encounters:  05/17/24 201 lb (91.2 kg)  03/18/23 207 lb (93.9 kg)  03/05/23 207 lb 8 oz (94.1 kg)   3. Immunizations/screenings/ancillary studies Immunization History  Administered Date(s) Administered   Influenza Inj Mdck Quad With Preservative 05/23/2018   Influenza-Unspecified 03/08/2021   PFIZER(Purple Top)SARS-COV-2 Vaccination 07/21/2019, 08/11/2019, 01/20/2020   Zoster Recombinant(Shingrix) 04/22/2024   Health Maintenance Due  Topic Date Due   Mammogram  Never done   Colonoscopy  Never done    4. Cervical cancer screening- 03/17/28 5. Breast cancer screening-  mammogram needs to sch 6. Colon cancer screening -  7. Skin cancer screening- advised regular sunscreen use. Denies worrisome, changing, or new skin lesions.  8. Birth control/STD check- n/a 9. Osteoporosis screening- n/a 10. Smoking  associated screening - non smoker  Wellness examination -     Lipid panel -     Comprehensive metabolic panel with GFR -     CBC with Differential/Platelet -     Hemoglobin A1c -     TSH    Assessment and Plan Assessment & Plan Fibromuscular dysplasia   This chronic condition currently presents no symptoms and requires no interventions.   General Health Maintenance   She is due for several immunizations and screenings. She recently received the shingles vaccine and plans to receive the second dose. She is considering a colonoscopy over Cologuard due to her husband's preference. She plans to maintain an active lifestyle and healthy diet. She will schedule a colonoscopy if insurance is maintained, receive the second shingles vaccine dose, and get a flu shot. Blood work will include cholesterol, sugars, sodium, potassium, and TSH.     Recommended follow up: Return in about 1  year (around 05/17/2025) for annual physical.  Lab/Order associations:+ fasting  Jenkins CHRISTELLA Carrel, MD      [1] No Known Allergies  "

## 2024-05-17 NOTE — Patient Instructions (Addendum)
 It was very nice to see you today!  Merry Christmas  Due shingles, Tdap, pneumonia, mammogram, colonoscopy   PLEASE NOTE:  If you had any lab tests please let us  know if you have not heard back within a few days. You may see your results on MyChart before we have a chance to review them but we will give you a call once they are reviewed by us . If we ordered any referrals today, please let us  know if you have not heard from their office within the next week.   Please try these tips to maintain a healthy lifestyle:  Eat most of your calories during the day when you are active. Eliminate processed foods including packaged sweets (pies, cakes, cookies), reduce intake of potatoes, white bread, white pasta, and white rice. Look for whole grain options, oat flour or almond flour.  Each meal should contain half fruits/vegetables, one quarter protein, and one quarter carbs (no bigger than a computer mouse).  Cut down on sweet beverages. This includes juice, soda, and sweet tea. Also watch fruit intake, though this is a healthier sweet option, it still contains natural sugar! Limit to 3 servings daily.  Drink at least 1 glass of water with each meal and aim for at least 8 glasses per day  Exercise at least 150 minutes every week.

## 2024-05-19 ENCOUNTER — Ambulatory Visit: Payer: Self-pay | Admitting: Family Medicine

## 2024-05-19 NOTE — Progress Notes (Signed)
 Labs are acceptable-of course, work on diet/exercise 2.  A1C(3 month average of sugars) is elevated.  This may be considered PreDiabetes.  Work on diet-decrease sugars and starches and aim for 30 minutes of exercise 5 days/week to prevent progression to diabetes

## 2024-05-23 ENCOUNTER — Ambulatory Visit: Admitting: Family Medicine

## 2025-05-28 ENCOUNTER — Encounter: Admitting: Family Medicine
